# Patient Record
Sex: Female | Born: 1935 | Race: Black or African American | Hispanic: No | Smoking: Never smoker
Health system: Southern US, Community
[De-identification: ages and names within clinical notes are randomized; demographics above are authoritative.]

## PROBLEM LIST (undated history)

## (undated) DIAGNOSIS — M1990 Unspecified osteoarthritis, unspecified site: Secondary | ICD-10-CM

## (undated) DIAGNOSIS — I10 Essential (primary) hypertension: Secondary | ICD-10-CM

## (undated) DIAGNOSIS — F0390 Unspecified dementia without behavioral disturbance: Secondary | ICD-10-CM

---

## 1998-04-11 ENCOUNTER — Other Ambulatory Visit: Admission: RE | Admit: 1998-04-11 | Discharge: 1998-04-11 | Payer: Self-pay | Admitting: Family Medicine

## 1998-05-19 ENCOUNTER — Ambulatory Visit (HOSPITAL_COMMUNITY): Admission: RE | Admit: 1998-05-19 | Discharge: 1998-05-19 | Payer: Self-pay | Admitting: Cardiology

## 2000-02-06 ENCOUNTER — Encounter: Payer: Self-pay | Admitting: Cardiology

## 2000-02-06 ENCOUNTER — Ambulatory Visit (HOSPITAL_COMMUNITY): Admission: RE | Admit: 2000-02-06 | Discharge: 2000-02-06 | Payer: Self-pay | Admitting: Cardiology

## 2003-03-23 ENCOUNTER — Other Ambulatory Visit: Admission: RE | Admit: 2003-03-23 | Discharge: 2003-03-23 | Payer: Self-pay | Admitting: Family Medicine

## 2003-03-23 ENCOUNTER — Other Ambulatory Visit: Admission: RE | Admit: 2003-03-23 | Discharge: 2003-03-23 | Payer: Self-pay | Admitting: Anesthesiology

## 2004-12-01 ENCOUNTER — Emergency Department (HOSPITAL_COMMUNITY): Admission: EM | Admit: 2004-12-01 | Discharge: 2004-12-02 | Payer: Self-pay | Admitting: Emergency Medicine

## 2005-01-21 ENCOUNTER — Ambulatory Visit (HOSPITAL_COMMUNITY): Admission: RE | Admit: 2005-01-21 | Discharge: 2005-01-21 | Payer: Self-pay | Admitting: Cardiology

## 2005-01-29 ENCOUNTER — Inpatient Hospital Stay (HOSPITAL_BASED_OUTPATIENT_CLINIC_OR_DEPARTMENT_OTHER): Admission: RE | Admit: 2005-01-29 | Discharge: 2005-01-29 | Payer: Self-pay | Admitting: Cardiology

## 2008-10-03 ENCOUNTER — Emergency Department (HOSPITAL_COMMUNITY): Admission: EM | Admit: 2008-10-03 | Discharge: 2008-10-03 | Payer: Self-pay | Admitting: Family Medicine

## 2009-07-05 ENCOUNTER — Encounter: Admission: RE | Admit: 2009-07-05 | Discharge: 2009-07-05 | Payer: Self-pay | Admitting: Orthopedic Surgery

## 2009-09-05 ENCOUNTER — Emergency Department (HOSPITAL_COMMUNITY): Admission: EM | Admit: 2009-09-05 | Discharge: 2009-09-06 | Payer: Self-pay | Admitting: Emergency Medicine

## 2010-09-05 ENCOUNTER — Emergency Department (HOSPITAL_COMMUNITY): Admission: EM | Admit: 2010-09-05 | Discharge: 2010-09-05 | Payer: Self-pay | Admitting: Emergency Medicine

## 2010-11-19 ENCOUNTER — Encounter: Payer: Self-pay | Admitting: Cardiology

## 2011-01-08 LAB — CBC
HCT: 27.1 % — ABNORMAL LOW (ref 36.0–46.0)
Hemoglobin: 8.9 g/dL — ABNORMAL LOW (ref 12.0–15.0)
MCH: 28.9 pg (ref 26.0–34.0)
MCV: 88 fL (ref 78.0–100.0)
WBC: 5.7 10*3/uL (ref 4.0–10.5)

## 2011-01-08 LAB — DIFFERENTIAL
Basophils Absolute: 0 10*3/uL (ref 0.0–0.1)
Basophils Relative: 1 % (ref 0–1)
Eosinophils Absolute: 0.3 10*3/uL (ref 0.0–0.7)
Monocytes Absolute: 0.5 10*3/uL (ref 0.1–1.0)
Monocytes Relative: 10 % (ref 3–12)

## 2011-01-08 LAB — COMPREHENSIVE METABOLIC PANEL
AST: 26 U/L (ref 0–37)
BUN: 7 mg/dL (ref 6–23)
Creatinine, Ser: 0.62 mg/dL (ref 0.4–1.2)
Glucose, Bld: 92 mg/dL (ref 70–99)
Total Bilirubin: 0.9 mg/dL (ref 0.3–1.2)
Total Protein: 5.9 g/dL — ABNORMAL LOW (ref 6.0–8.3)

## 2011-01-08 LAB — URINALYSIS, ROUTINE W REFLEX MICROSCOPIC
Hgb urine dipstick: NEGATIVE
Ketones, ur: NEGATIVE mg/dL
Nitrite: NEGATIVE
Protein, ur: NEGATIVE mg/dL
Specific Gravity, Urine: 1.013 (ref 1.005–1.030)

## 2011-03-15 NOTE — Cardiovascular Report (Signed)
[name], [name]           ACCOUNT NO.:  [id number]   MEDICAL RECORD NO.:  [id number]          PATIENT TYPE:  OIB   LOCATION:  6501                         FACILITY:  [facility]   PHYSICIAN:  Mohan N. Sharyn Lull, M.D. DATE OF BIRTH:  Mar 10, 1927   DATE OF PROCEDURE:  01/29/2005  DATE OF DISCHARGE:                              CARDIAC CATHETERIZATION   PROCEDURE:  Left cardiac catheterization with selective left and right  coronary angiography, left ventriculography via right groin using Judkins  technique.   INDICATIONS FOR PROCEDURE:  Leah [name] is a 77 year old black female  with past medical history significant for hypertension, hypercholesteremia,  and degenerative joint disease who comes in with vague retrosternal chest  pain and left arm pain without associated symptoms of nausea, vomiting or  diaphoresis. Denies shortness of breath. Denies palpitation,  lightheadedness, or syncope. Denies PND, orthopnea, leg swelling. EKG done  in the office showed normal sinus rhythm with nonspecific ST-T wave changes.  The patient subsequently underwent Persantine Myoview on 01/21/2005 which  showed inducible ischemia in the inferior wall at the base with EF of 66%  and questionable mild scarring in the anteroseptal wall.   PAST MEDICAL HISTORY:  As above.   PAST SURGICAL HISTORY:  She had hysterectomy in 1989, C-section in 1969,  right foot surgery in 1994, and left foot and ankle fracture in 1995.   ALLERGIES:  PENICILLIN.   MEDICATIONS AT HOME:  1.  Lotrel 5/20, p.o. b.i.d. 2. Baby aspirin 81 milligrams p.o. q.d. 3.      Vicodin 5/500 p.r.n. for pain.   SOCIAL HISTORY:  She is married, has five children. Retired, worked as  Agricultural engineer. Born in [place], lives in [place]. No history  of smoking or alcohol abuse.   FAMILY HISTORY:  Positive for coronary artery disease and congestive heart  failure.   PHYSICAL EXAMINATION:  GENERAL: She is alert and oriented x3  in no acute  distress. VITAL SIGNS: Blood pressure was 160/84, pulse was 68 regular.  HEENT: Conjunctiva pink. NECK: Supple. No JVD, no bruit. LUNGS: Clear to  auscultation without rhonchi  or rubs. CARDIAC:  S1 and S2 was normal. There  was a soft S4 gallop. ABDOMEN: Soft, bowel sounds present, nontender.  EXTREMITIES: There is no clubbing, cyanosis, or edema.   IMPRESSION:  1.  Chest pain, mildly positive Persantine Myoview, rule out coronary      insufficiency.  2.  Uncontrolled hypertension.  3.  Hypercholesteremia.  4.  Elevated blood sugar, rule out diabetes mellitus.  5.  Degenerative joint disease.   PLAN:  Discussed with the patient regarding left cath, its risks, i.e.  death, MI, stroke, the need for emergency CABG, local vascular  complications, etc., and consented for the procedure.   PROCEDURE:  After obtaining informed consent, the patient was brought to the  cath lab and was placed on fluoroscopy table. Right groin was prepped and  draped in usual fashion. Xylocaine 2% was used for local anesthesia in the  right groin. With the help of a thin-wall needle, a 4-French arterial sheath  was placed. The sheath was aspirated and  flushed. Next, 4-French left  Judkins catheter was advanced over the wire under fluoroscopic guidance into  the ascending aorta. Wire was pulled out, the catheter was aspirated, and  connected to the manifold.  The catheter was further advanced and engaged  into left coronary ostium. Multiple views of the left system were taken.  Next, the catheter was disengaged and was pulled out over the wire and was  replaced with 4-French right Judkins catheter which was advanced over the  wire under fluoroscopic guidance up to the ascending aorta. Wire was pulled  out, the catheter was aspirated, and connected to the manifold. Catheter was  further advanced and engaged into right coronary ostium with multiple views  of the right system  taken. Next, the  catheter was disengaged and was pulled  out over the wire and was replaced with 4-French pigtail catheter which was  advanced over the wire under fluoroscopic guidance up to the ascending  aorta. Wire was pulled out, the catheter was aspirated, and connected to the  manifold. Catheter was further advanced across aortic valve into the LV. LV  pressures were recorded. Next, LV graft was done in 30 degrees RAO position.  Post angiographic pressures were recorded from LV and then pullback  pressures were recorded from the aorta. Next, the pigtail catheter was  pulled out over the wire. Sheaths aspirated and flushed.   FINDINGS:  LV showed good LV systolic function, EF of 60-65%. Left main was  patent. LAD has 15-20% distal stenosis. Diagonal one was very small which  has 40-50% mid stenosis. Diagonal two was small which was patent. Diagonal  three was small which was patent. Left circumflex was patent. OM1 was  patent. RCA has 40-50% ostial and proximal stenosis with haziness with TIMI  grade 3 distal flow. There was no dampening of pressure during the  injection. The patient tolerated procedure well. There are no complications.  The patient was transferred to recovery room in stable condition.      MNH/MEDQ  D:  01/29/2005  T:  01/29/2005  Job:  [id number]

## 2011-06-02 ENCOUNTER — Emergency Department (HOSPITAL_COMMUNITY)
Admission: EM | Admit: 2011-06-02 | Discharge: 2011-06-02 | Disposition: A | Discharge status: Home / Self Care | Payer: Medicare Other | Attending: Emergency Medicine | Admitting: Emergency Medicine

## 2011-06-02 ENCOUNTER — Emergency Department (HOSPITAL_COMMUNITY): Payer: Medicare Other

## 2011-06-02 DIAGNOSIS — I10 Essential (primary) hypertension: Secondary | ICD-10-CM | POA: Insufficient documentation

## 2011-06-02 DIAGNOSIS — S0180XA Unspecified open wound of other part of head, initial encounter: Secondary | ICD-10-CM | POA: Insufficient documentation

## 2011-06-02 DIAGNOSIS — M1990 Unspecified osteoarthritis, unspecified site: Secondary | ICD-10-CM | POA: Insufficient documentation

## 2011-06-02 DIAGNOSIS — R61 Generalized hyperhidrosis: Secondary | ICD-10-CM | POA: Insufficient documentation

## 2011-06-02 DIAGNOSIS — S0003XA Contusion of scalp, initial encounter: Secondary | ICD-10-CM | POA: Insufficient documentation

## 2011-06-02 DIAGNOSIS — R05 Cough: Secondary | ICD-10-CM | POA: Insufficient documentation

## 2011-06-02 DIAGNOSIS — E789 Disorder of lipoprotein metabolism, unspecified: Secondary | ICD-10-CM | POA: Insufficient documentation

## 2011-06-02 DIAGNOSIS — R059 Cough, unspecified: Secondary | ICD-10-CM | POA: Insufficient documentation

## 2011-06-02 DIAGNOSIS — Z79899 Other long term (current) drug therapy: Secondary | ICD-10-CM | POA: Insufficient documentation

## 2011-06-02 DIAGNOSIS — H18419 Arcus senilis, unspecified eye: Secondary | ICD-10-CM | POA: Insufficient documentation

## 2011-06-02 DIAGNOSIS — R110 Nausea: Secondary | ICD-10-CM | POA: Insufficient documentation

## 2011-06-02 DIAGNOSIS — S1093XA Contusion of unspecified part of neck, initial encounter: Secondary | ICD-10-CM | POA: Insufficient documentation

## 2011-06-02 DIAGNOSIS — W06XXXA Fall from bed, initial encounter: Secondary | ICD-10-CM | POA: Insufficient documentation

## 2011-06-02 LAB — URINALYSIS, ROUTINE W REFLEX MICROSCOPIC
Bilirubin Urine: NEGATIVE
Glucose, UA: NEGATIVE mg/dL
Ketones, ur: NEGATIVE mg/dL
Nitrite: NEGATIVE
Protein, ur: NEGATIVE mg/dL
Urobilinogen, UA: 0.2 mg/dL (ref 0.0–1.0)

## 2011-06-02 LAB — POCT I-STAT, CHEM 8
BUN: 46 mg/dL — ABNORMAL HIGH (ref 6–23)
Chloride: 108 mEq/L (ref 96–112)
Hemoglobin: 10.9 g/dL — ABNORMAL LOW (ref 12.0–15.0)
Sodium: 147 mEq/L — ABNORMAL HIGH (ref 135–145)

## 2011-06-02 LAB — DIFFERENTIAL
Basophils Relative: 0 % (ref 0–1)
Lymphocytes Relative: 12 % (ref 12–46)
Neutro Abs: 7.1 10*3/uL (ref 1.7–7.7)
Neutrophils Relative %: 81 % — ABNORMAL HIGH (ref 43–77)

## 2011-06-02 LAB — TROPONIN I: Troponin I: <0.3 ng/mL (ref <0.30)

## 2011-06-02 LAB — CBC
HCT: 32.4 % — ABNORMAL LOW (ref 36.0–46.0)
MCHC: 32.4 g/dL (ref 30.0–36.0)
MCV: 85.7 fL (ref 78.0–100.0)
Platelets: 285 10*3/uL (ref 150–400)
RDW: 14.1 % (ref 11.5–15.5)
WBC: 8.7 10*3/uL (ref 4.0–10.5)

## 2011-06-02 LAB — URINE MICROSCOPIC-ADD ON

## 2011-06-02 LAB — CK TOTAL AND CKMB (NOT AT ARMC): CK, MB: 2.7 ng/mL (ref 0.3–4.0)

## 2011-06-05 ENCOUNTER — Inpatient Hospital Stay (HOSPITAL_COMMUNITY)
Admission: EM | Admit: 2011-06-05 | Discharge: 2011-06-09 | DRG: 381 | Disposition: A | Discharge status: Home / Self Care | Payer: Medicare Other | Attending: Cardiology | Admitting: Cardiology

## 2011-06-05 ENCOUNTER — Emergency Department (HOSPITAL_COMMUNITY): Payer: Medicare Other

## 2011-06-05 DIAGNOSIS — I2510 Atherosclerotic heart disease of native coronary artery without angina pectoris: Secondary | ICD-10-CM | POA: Diagnosis present

## 2011-06-05 DIAGNOSIS — K2211 Ulcer of esophagus with bleeding: Principal | ICD-10-CM | POA: Diagnosis present

## 2011-06-05 DIAGNOSIS — N390 Urinary tract infection, site not specified: Secondary | ICD-10-CM | POA: Diagnosis present

## 2011-06-05 DIAGNOSIS — D500 Iron deficiency anemia secondary to blood loss (chronic): Secondary | ICD-10-CM | POA: Diagnosis present

## 2011-06-05 DIAGNOSIS — I10 Essential (primary) hypertension: Secondary | ICD-10-CM | POA: Diagnosis present

## 2011-06-05 DIAGNOSIS — Z79899 Other long term (current) drug therapy: Secondary | ICD-10-CM

## 2011-06-05 DIAGNOSIS — K254 Chronic or unspecified gastric ulcer with hemorrhage: Secondary | ICD-10-CM | POA: Diagnosis present

## 2011-06-05 DIAGNOSIS — K5909 Other constipation: Secondary | ICD-10-CM | POA: Diagnosis present

## 2011-06-05 DIAGNOSIS — E785 Hyperlipidemia, unspecified: Secondary | ICD-10-CM | POA: Diagnosis present

## 2011-06-05 DIAGNOSIS — M1990 Unspecified osteoarthritis, unspecified site: Secondary | ICD-10-CM | POA: Diagnosis present

## 2011-06-05 LAB — CBC
HCT: 27.1 % — ABNORMAL LOW (ref 36.0–46.0)
MCH: 28.5 pg (ref 26.0–34.0)
MCHC: 33.2 g/dL (ref 30.0–36.0)
Platelets: 332 10*3/uL (ref 150–400)
RDW: 14.1 % (ref 11.5–15.5)
WBC: 8.1 10*3/uL (ref 4.0–10.5)

## 2011-06-05 LAB — URINE MICROSCOPIC-ADD ON

## 2011-06-05 LAB — DIFFERENTIAL
Eosinophils Absolute: 0.2 10*3/uL (ref 0.0–0.7)
Eosinophils Relative: 3 % (ref 0–5)
Monocytes Absolute: 0.6 10*3/uL (ref 0.1–1.0)
Monocytes Relative: 8 % (ref 3–12)
Neutro Abs: 5.7 10*3/uL (ref 1.7–7.7)
Neutrophils Relative %: 71 % (ref 43–77)

## 2011-06-05 LAB — COMPREHENSIVE METABOLIC PANEL
BUN: 11 mg/dL (ref 6–23)
CO2: 27 mEq/L (ref 19–32)
Calcium: 8.9 mg/dL (ref 8.4–10.5)
Chloride: 102 mEq/L (ref 96–112)
Creatinine, Ser: 0.63 mg/dL (ref 0.50–1.10)
GFR calc Af Amer: >60 mL/min (ref >60)
GFR calc non Af Amer: >60 mL/min (ref >60)
Glucose, Bld: 90 mg/dL (ref 70–99)
Total Bilirubin: 0.4 mg/dL (ref 0.3–1.2)

## 2011-06-05 LAB — POCT I-STAT TROPONIN I

## 2011-06-05 LAB — GLUCOSE, CAPILLARY: Glucose-Capillary: 119 mg/dL — ABNORMAL HIGH (ref 70–99)

## 2011-06-05 LAB — URINALYSIS, ROUTINE W REFLEX MICROSCOPIC
Glucose, UA: NEGATIVE mg/dL
Urobilinogen, UA: 0.2 mg/dL (ref 0.0–1.0)

## 2011-06-05 LAB — BASIC METABOLIC PANEL
Calcium: 9.4 mg/dL (ref 8.4–10.5)
GFR calc Af Amer: >60 mL/min (ref >60)
GFR calc non Af Amer: >60 mL/min (ref >60)
Potassium: 4 mEq/L (ref 3.5–5.1)
Sodium: 141 mEq/L (ref 135–145)

## 2011-06-05 LAB — OCCULT BLOOD, POC DEVICE: Fecal Occult Bld: POSITIVE

## 2011-06-05 LAB — PROTIME-INR: Prothrombin Time: 13.7 seconds (ref 11.6–15.2)

## 2011-06-05 LAB — HEMOGLOBIN AND HEMATOCRIT, BLOOD
HCT: 26.2 % — ABNORMAL LOW (ref 36.0–46.0)
Hemoglobin: 8.8 g/dL — ABNORMAL LOW (ref 12.0–15.0)

## 2011-06-06 ENCOUNTER — Other Ambulatory Visit: Payer: Self-pay | Admitting: Gastroenterology

## 2011-06-06 LAB — BASIC METABOLIC PANEL
BUN: 6 mg/dL (ref 6–23)
GFR calc Af Amer: >60 mL/min (ref >60)
GFR calc non Af Amer: >60 mL/min (ref >60)
Potassium: 4.1 mEq/L (ref 3.5–5.1)
Sodium: 140 mEq/L (ref 135–145)

## 2011-06-06 LAB — LIPID PANEL
Cholesterol: 134 mg/dL (ref 0–200)
LDL Cholesterol: 76 mg/dL (ref 0–99)
Total CHOL/HDL Ratio: 2.7 RATIO
Triglycerides: 45 mg/dL (ref <150)
VLDL: 9 mg/dL (ref 0–40)

## 2011-06-06 LAB — URINE CULTURE: Culture  Setup Time: 201208081706

## 2011-06-06 LAB — CBC
HCT: 25.8 % — ABNORMAL LOW (ref 36.0–46.0)
MCHC: 32.9 g/dL (ref 30.0–36.0)
Platelets: 330 10*3/uL (ref 150–400)
RDW: 14.4 % (ref 11.5–15.5)

## 2011-06-06 LAB — HEMOGLOBIN AND HEMATOCRIT, BLOOD: Hemoglobin: 9.1 g/dL — ABNORMAL LOW (ref 12.0–15.0)

## 2011-06-07 LAB — URINALYSIS, MICROSCOPIC ONLY
Glucose, UA: NEGATIVE mg/dL
Ketones, ur: NEGATIVE mg/dL
Leukocytes, UA: NEGATIVE
Nitrite: NEGATIVE
Specific Gravity, Urine: 1.019 (ref 1.005–1.030)
pH: 5 (ref 5.0–8.0)

## 2011-06-07 LAB — CBC
Hemoglobin: 8.9 g/dL — ABNORMAL LOW (ref 12.0–15.0)
MCH: 27.8 pg (ref 26.0–34.0)
Platelets: 360 10*3/uL (ref 150–400)
RBC: 3.2 MIL/uL — ABNORMAL LOW (ref 3.87–5.11)
WBC: 6.4 10*3/uL (ref 4.0–10.5)

## 2011-06-08 LAB — URINE CULTURE: Culture: NO GROWTH

## 2011-06-08 LAB — CROSSMATCH
ABO/RH(D): O POS
Unit division: 0

## 2011-06-08 LAB — CBC
HCT: 26.7 % — ABNORMAL LOW (ref 36.0–46.0)
MCH: 28.2 pg (ref 26.0–34.0)
MCHC: 33 g/dL (ref 30.0–36.0)
MCV: 85.6 fL (ref 78.0–100.0)
Platelets: 349 10*3/uL (ref 150–400)
RDW: 14.5 % (ref 11.5–15.5)
WBC: 7.4 10*3/uL (ref 4.0–10.5)

## 2011-06-08 LAB — HEMOGLOBIN AND HEMATOCRIT, BLOOD: Hemoglobin: 8.9 g/dL — ABNORMAL LOW (ref 12.0–15.0)

## 2011-06-08 LAB — BASIC METABOLIC PANEL
BUN: 14 mg/dL (ref 6–23)
Calcium: 9.4 mg/dL (ref 8.4–10.5)
Chloride: 108 mEq/L (ref 96–112)
Creatinine, Ser: 0.75 mg/dL (ref 0.50–1.10)
GFR calc Af Amer: >60 mL/min (ref >60)

## 2011-06-09 LAB — CBC
HCT: 27.3 % — ABNORMAL LOW (ref 36.0–46.0)
Hemoglobin: 9 g/dL — ABNORMAL LOW (ref 12.0–15.0)
MCHC: 33 g/dL (ref 30.0–36.0)
RDW: 14.5 % (ref 11.5–15.5)
WBC: 7.4 10*3/uL (ref 4.0–10.5)

## 2011-07-10 NOTE — Consult Note (Signed)
[name], [name]           ACCOUNT NO.:  [id number]  MEDICAL RECORD NO.:  [id number]  LOCATION:  3742                         FACILITY:  [facility]  PHYSICIAN:  Anselmo Rod, MD, FACGDATE OF BIRTH:  10-16-1927  DATE OF CONSULTATION:  06/05/2011 DATE OF DISCHARGE:                                CONSULTATION   REASON FOR CONSULTATION: 1. Melena for the last 5 months. 2. Anemia with hemoglobin of 9 g/dl on admission.  ASSESSMENT: 1. Melena since March of this year with recent syncope and anemia,     rule out peptic ulcer disease, arteriovenous malformations versus     colonic masses, polyps, etc.  Patient has an increased BUN of 46,     question possible upper gastrointestinal bleed. 2. Chronic constipation on Correctol and Milk of Magnesia at bedtime. 3. Hypertension on Norvasc and Lotensin at home. 4. Coronary artery disease. 5. Hyperlipidemia. 6. Degenerative joint disease on Advil and Aleve recently. 7. Status post partial hysterectomy in the remote past. 8. History of 7 pregnancies and 2 stillbirths with 5 livebirths. 9. History of cesarean section in remote past. 10.History of right foot surgery in the past. 11.History of left ankle fracture.  RECOMMENDATIONS: 1. Esophagogastroduodenoscopy tomorrow morning.  Patient should be     kept NPO after midnight tonight. 2. Colonoscopy as soon as possible.  Patient claims she has never had     a baseline screening colonoscopy in the past. 3. Serial CBCs to be continued. 4. Type and cross, transfuse as needed to keep the hemoglobin above 8     g/dl. 5. Avoid all nonsteroidals for now.  DISCUSSION:  [name] is a pleasant 77 year old black female with above-mentioned medical problems, who had a question of syncopal versus near-syncopal episode 3 days ago while trying to get out of bed and hit her forehead on the left side against the playpen laying close to her bed.  She said she was cold and clammy and had  black stools.  Since March of this year, she claims, stool samples have been done in the past, but is not aware of the results.  She denies having a screening colonoscopy in the past.  She has never been to see a gastroenterologist as best she can recollect.  She has 1-2 BMs per day after taking Correctol and Milk of Magnesia on a daily basis, which she usually does at night.  Appetite is fairly good.  Her weight has been stable.  She has been taking Aleve and Advil lately to help with her arthritis.  There is no history of bright red bleeding per rectum.  She denies history of ulcers, jaundice, or colitis.  She has had some abdominal queasiness when she takes aspirin products and therefore she stopped her regular aspirin, decreased the aspirin to 81 mg per day. She denies any shortness of breath, palpitation, or lightheadedness at this time.  There is no history of fevers, chills, or rigors.  PAST MEDICAL HISTORY:  See list above.  ALLERGIES:  PENICILLIN.  MEDICATIONS AT HOME:  Lotensin, Norvasc.  SOCIAL HISTORY:  She is a widow.  She is a retired Agricultural engineer. She was born in [place] and lives in  [place].  She has 5 children.  She denies use of alcohol, tobacco, or drugs.  FAMILY HISTORY:  Positive for coronary artery disease and congestive heart failure.  There is no family history of colon cancer or gastric cancer to the best of her knowledge.  REVIEW OF SYSTEMS: 1. Black stools. 2. One episode of coffee-ground emesis on the day of admission. 3. Chronic constipation. 4. Joint pain.  GENERAL PHYSICAL EXAMINATION:  GENERAL:  Reveals a pleasant, cooperative, elderly, black female laying comfortably in bed in no acute distress.  She is alert and oriented x3. VITAL SIGNS:  Temperature 98.2, blood pressure 136/69, pulse 70 per minute and regular, respiratory rate 18. NECK:  Supple. CHEST:  Clear to auscultation. HEART:  S1, S2 regular. ABDOMEN:  Soft with a  central scar in the midline below the umbilicus. Abdomen nontender with normal bowel sounds.  No hepatosplenomegaly appreciated. RECTAL:  Examination was not done today.  LABORATORY EVALUATION:  White count 8.12 with a hemoglobin of 9 and hematocrit of 47.1, MCV 85.8.  PT 13.7 with INR of 1.03 and a PTT of 32. Her BUN was 46 on August 5th, but has normalized since admission and is 11 mg/dl today.  CMP otherwise reveals an albumin of 3.1, total protein 5.7, calcium 8.9.  An H and H done earlier today reveals a hemoglobin of 8.8 with a hematocrit of 26.2.  Chest x-ray done today revealed prominent thoracic aorta, but no other cardiopulmonary disease.  CT of the head without contrast done on admission revealed no acute abnormalities.  PLAN:  As above.  Further recommendations will be made in follow up.     Anselmo Rod, MD, [facility]     JNM/MEDQ  D:  06/05/2011  T:  06/06/2011  Job:  [id number]  cc:   Eduardo Osier. Sharyn Lull, M.D.  Electronically Signed by Charna Elizabeth M.D. on 07/10/2011 06:40:42 PM

## 2011-08-07 NOTE — Discharge Summary (Signed)
NAMEALDEEN, RIGA           ACCOUNT NO.:  [id number]  MEDICAL RECORD NO.:  [id number]  LOCATION:  3742                         FACILITY:  [facility]  PHYSICIAN:  Shakera Ebrahimi N. Sharyn Lull, M.D. DATE OF BIRTH:  [birth year]  DATE OF ADMISSION:  06/05/2011 DATE OF DISCHARGE:  06/09/2011                              DISCHARGE SUMMARY   ADMITTING DIAGNOSES: 1. Acute upper gastrointestinal bleeding. 2. Coronary artery disease. 3. Hypertension. 4. Hypercholesteremia. 5. Anemia secondary to #1. 6. Degenerative joint disease.  DISCHARGE DIAGNOSES: 1. Status post upper gastrointestinal bleeding secondary to esophageal     and antral ulcers. 2. Anemia secondary to upper gastrointestinal bleeding as above,     stable. 3. Coronary artery disease. 4. Hypertension. 5. Hypercholesteremia. 6. History of syncope in the past. 7. Constipation. 8. Probable urinary tract infection.  DISCHARGE HOME MEDICATIONS: 1. Amlodipine 5 mg 1 tablet daily. 2. Ciprofloxacin 500 mg 1 tablet twice daily for 5 more days. 3. Protonix 40 mg 1 tablet twice daily with meals. 4. Carafate 1 g 3 times daily between meals. 5. Benazepril 20 mg 1 tablet twice daily.  DIET:  Low salt, low cholesterol.  ACTIVITY:  Increase activity slowly as tolerated.  Follow up with me in 1 week.  Follow up with GI, Dr. Loreta Ave, in 2 weeks.  CONDITION AT DISCHARGE:  Stable.  BRIEF HISTORY AND HOSPITAL COURSE:  [name] is an 77 year old black female with past medical history significant for moderate coronary artery disease, hypertension, hypercholesteremia, degenerative joint disease, who came to the ER, feeling cold, shaky, associated with black tarry stool for the last few weeks.  States she had fall a few days ago and was noted to have hemoglobin of 11 a few days ago and today with significant drop of hemoglobin from 11 to 9.  States has been taking 3 Aleve daily because of joint pain.  Denies any chest pain, nausea,  vomiting, diaphoresis.  Denies PND, orthopnea, and leg swelling. Denies palpitation, lightheadedness, or syncope.  Denies any abdominal pain.  No fever, no chills, or urinary complaints.  PAST MEDICAL HISTORY:  As above.  PAST SURGICAL HISTORY:  She had hysterectomy in the past, had C-section in the past, had right foot surgery in the past, had left ankle and foot fracture in the past.  ALLERGIES:  She is allergic to PENICILLIN.  MEDICATION AT HOME:  She was on Norvasc and Lotensin.  SOCIAL HISTORY:  She is widowed, retired, worked as Agricultural engineer in the past.  No history of smoking or alcohol abuse.  FAMILY HISTORY:  Positive for coronary artery disease and congestive heart failure.  PHYSICAL EXAMINATION:  GENERAL:  She was alert, awake, and oriented x3, in no acute distress. VITAL SIGNS:  Blood pressure was 152/66, pulse was 80 and regular. HEENT:  Conjunctivae were pink. NECK:  Supple.  No JVD, no bruit. LUNGS:  Clear to auscultation without rhonchi or rales. CARDIOVASCULAR:  S1 and S2 was normal.  There was soft systolic murmur. There was no S3 gallop. ABDOMEN:  Soft.  Bowel sounds were present.  There was mild epigastric tenderness.  There was no guarding. EXTREMITIES:  There was no clubbing, cyanosis, or edema.  LABS:  Her admission hemoglobin was 9, hematocrit 27.1, white count of 8.1.  Her repeat hemoglobin on June 06, 2011, was 8.5, hematocrit 25.8. Repeat hemoglobin on June 08, 2011, was 8.8, hematocrit 26.7.  On June 09, 2011, hemoglobin was 9, hematocrit 27.3 which has been stable.  Her sodium was 141, potassium 4.0, BUN was 12, creatinine 0.73, glucose was 123.  Repeat electrolytes on June 08, 2011; sodium 143, potassium 3.9, chloride 108, bicarb 29, glucose 82, BUN 14, creatinine 0.75.  Her total protein was low at 5.7, albumin 3.1.  Liver enzymes were all normal.  Cholesterol was 134, triglycerides 45, HDL 49, LDL 76. Urinalysis showed urine  appearance was hazy and there were many epithelial cells, small leukocyte esterase, few bacteria.  Urine culture grew more than 100 colonies/mL, multiple bacterial morphotypes present. Repeat culture on antibiotics, there was no growth.  Occult stool blood was positive.  Her chest x-ray showed no acute cardiopulmonary disease or significant interval change.  BRIEF HOSPITAL COURSE:  The patient was admitted to telemetry unit.  GI consultation was obtained with Dr. Loreta Ave.  The patient subsequently underwent upper endoscopy which showed distal esophageal ulcer and multiple antral ulcers were noted which were biopsied.  The patient's H and H were monitored closely and remained stable during the hospital stay.  The patient did not have any further episodes of GI bleeding during the hospital stay.  The patient was started on Carafate and PPI with resolution of abdominal tenderness.  The patient's hemoglobin remained stable and was discharged home on June 09, 2011, in stable condition.  The patient will be followed up in my office in 1 week and GI in 2 weeks.  The patient was advised to stay off nonsteroidal antiinflammatory medications including also aspirin and was discharged in stable condition on June 09, 2011.     Eduardo Osier. Sharyn Lull, M.D.     MNH/MEDQ  D:  07/10/2011  T:  07/11/2011  Job:  [id number]  Electronically Signed by Rinaldo Cloud M.D. on 08/07/2011 07:31:20 PM

## 2012-06-05 ENCOUNTER — Other Ambulatory Visit: Payer: Self-pay

## 2012-06-05 ENCOUNTER — Emergency Department (HOSPITAL_COMMUNITY)
Admission: EM | Admit: 2012-06-05 | Discharge: 2012-06-05 | Disposition: A | Discharge status: Home / Self Care | Payer: Medicare Other | Attending: Emergency Medicine | Admitting: Emergency Medicine

## 2012-06-05 ENCOUNTER — Emergency Department (HOSPITAL_COMMUNITY): Payer: Medicare Other

## 2012-06-05 ENCOUNTER — Encounter (HOSPITAL_COMMUNITY): Payer: Self-pay | Admitting: Emergency Medicine

## 2012-06-05 DIAGNOSIS — J069 Acute upper respiratory infection, unspecified: Secondary | ICD-10-CM

## 2012-06-05 DIAGNOSIS — R42 Dizziness and giddiness: Secondary | ICD-10-CM | POA: Insufficient documentation

## 2012-06-05 DIAGNOSIS — I10 Essential (primary) hypertension: Secondary | ICD-10-CM | POA: Insufficient documentation

## 2012-06-05 DIAGNOSIS — F17200 Nicotine dependence, unspecified, uncomplicated: Secondary | ICD-10-CM | POA: Insufficient documentation

## 2012-06-05 HISTORY — DX: Essential (primary) hypertension: I10

## 2012-06-05 LAB — BASIC METABOLIC PANEL
Calcium: 10.1 mg/dL (ref 8.4–10.5)
GFR calc Af Amer: 87 mL/min — ABNORMAL LOW (ref >90)
GFR calc non Af Amer: 75 mL/min — ABNORMAL LOW (ref >90)
Glucose, Bld: 82 mg/dL (ref 70–99)
Sodium: 140 mEq/L (ref 135–145)

## 2012-06-05 LAB — URINALYSIS, ROUTINE W REFLEX MICROSCOPIC
Hgb urine dipstick: NEGATIVE
Protein, ur: NEGATIVE mg/dL
Urobilinogen, UA: 0.2 mg/dL (ref 0.0–1.0)

## 2012-06-05 LAB — CBC WITH DIFFERENTIAL/PLATELET
Basophils Absolute: 0 10*3/uL (ref 0.0–0.1)
Basophils Relative: 1 % (ref 0–1)
Eosinophils Absolute: 0.1 10*3/uL (ref 0.0–0.7)
Eosinophils Relative: 2 % (ref 0–5)
Lymphs Abs: 2 10*3/uL (ref 0.7–4.0)
MCH: 28.2 pg (ref 26.0–34.0)
MCHC: 33.3 g/dL (ref 30.0–36.0)
MCV: 84.7 fL (ref 78.0–100.0)
Platelets: 324 10*3/uL (ref 150–400)
RDW: 12.7 % (ref 11.5–15.5)

## 2012-06-05 MED ORDER — TRAMADOL HCL 50 MG PO TABS: 50.0000 mg | ORAL_TABLET | Freq: Four times a day (QID) | ORAL | Status: AC | PRN

## 2012-06-05 NOTE — ED Notes (Signed)
Pt updated on wait times. NAD noted. No complaints at this time.

## 2012-06-05 NOTE — ED Notes (Signed)
Pt c/o dizziness intermittently x 4 days; pt sts feels like room is spinning

## 2012-06-05 NOTE — ED Provider Notes (Addendum)
History   This chart was scribed for Celene Kras, MD by Toya Smothers. The patient was seen in room TR10C/TR10C. Patient's care was started at 1154.  CSN: [id number] Arrival date & time 06/05/12  1154 First MD Initiated Contact with Patient 06/05/12 1707      Chief Complaint  Patient presents with  . Dizziness   The history is provided by the patient. No language interpreter was used.    Leah Glass is a 78 y.o. female with a h/o hypertension presents to the ED complaining of chills and involuntary occular movements. Pt reports that 4 days ago she began feeling sudden intermittent chills with associate HA, that deviate from asymptomatic baseline behavior. Pt spoke with PCP who advised Bayer ASA with no relief. 3 days ago Pt also began to have involuntary occular movements of her left eye. Pt denies speech change, coordination change, dizziness, fever, and emesis. She denies headache at this time.  No chest pain.  Not feeling dizzy or lightheaded.  She feels she can walk without difficulty.  Past Medical History  Diagnosis Date  . Hypertension     History reviewed. No pertinent past surgical history.  History reviewed. No pertinent family history.  History  Substance Use Topics  . Smoking status: Current Everyday Smoker  . Smokeless tobacco: Not on file  . Alcohol Use: No   Review of Systems  Constitutional: Positive for chills. Negative for fever.  HENT: Negative for hearing loss, ear pain and rhinorrhea.   Eyes: Negative for pain.  Respiratory: Negative for cough and shortness of breath.   Cardiovascular: Negative for chest pain.  Gastrointestinal: Negative for nausea, vomiting, abdominal pain and diarrhea.  Genitourinary: Negative for dysuria, hematuria and flank pain.  Musculoskeletal: Negative for back pain and gait problem.  Skin: Negative for pallor, rash and wound.  Neurological: Positive for light-headedness and headaches. Negative for dizziness, syncope, speech  difficulty, weakness and numbness.  All other systems reviewed and are negative.    Allergies  Penicillins  Home Medications   Current Outpatient Rx  Name Route Sig Dispense Refill  . AMLODIPINE BESYLATE 5 MG PO TABS Oral Take 5 mg by mouth 2 (two) times daily.    . ASPIRIN EC 81 MG PO TBEC Oral Take 81 mg by mouth daily.    Marland Kitchen BENAZEPRIL HCL 20 MG PO TABS Oral Take 20 mg by mouth 2 (two) times daily.    Marland Kitchen FERROUS SULFATE 325 (65 FE) MG PO TABS Oral Take 325 mg by mouth daily with breakfast.    . PANTOPRAZOLE SODIUM 40 MG PO TBEC Oral Take 40 mg by mouth 2 (two) times daily.      BP 161/80  Pulse 64  Temp 98.2 F (36.8 C) (Oral)  Resp 16  SpO2 100%  Physical Exam  Nursing note and vitals reviewed. Constitutional: No distress.       Elderly.  HENT:  Head: Normocephalic and atraumatic.  Right Ear: External ear normal.  Left Ear: External ear normal.  Mouth/Throat: No oropharyngeal exudate.  Eyes: Conjunctivae and EOM are normal. Pupils are equal, round, and reactive to light. Right eye exhibits no discharge. Left eye exhibits no discharge. No scleral icterus.  Neck: Neck supple. No tracheal deviation present.  Cardiovascular: Normal rate, regular rhythm and intact distal pulses.   No murmur heard. Pulmonary/Chest: Effort normal and breath sounds normal. No stridor. No respiratory distress. She has no wheezes.  Abdominal: Soft. Bowel sounds are normal. She exhibits no distension.  There is no tenderness. There is no rebound.  Musculoskeletal: She exhibits no edema and no tenderness.       Baseline ROM, moves extremities with no obvious new focal weakness. Normal gait.  Lymphadenopathy:    She has cervical adenopathy (mild bilaterally).  Neurological: She is alert. She has normal strength. No sensory deficit. Cranial nerve deficit:  no gross defecits noted. She exhibits normal muscle tone. She displays no seizure activity. Coordination normal.       Awake, alert, cooperative  and aware of situation; motor strength bilaterally; sensation normal to light touch bilaterally; peripheral visual fields full to confrontation; no facial asymmetry; tongue midline; major cranial nerves appear intact; no pronator drift, normal finger to nose bilaterally, baseline gait without new ataxia.  Skin: Skin is warm and dry. No rash noted.  Psychiatric: She has a normal mood and affect.    ED Course  Procedures (including critical care time) DIAGNOSTIC STUDIES: Oxygen Saturation is 100% on room air, normal by my interpretation.    COORDINATION OF CARE: 1209- Ordered CBC w/ DifferentialSTAT 1700- Ordered EKG Once. 1716- Evaluated Pt. Pt is without distress, awake, and oriented.   Labs Reviewed  BASIC METABOLIC PANEL - Abnormal; Notable for the following:    GFR calc non Af Amer 75 (*)     GFR calc Af Amer 87 (*)     All other components within normal limits  URINALYSIS, ROUTINE W REFLEX MICROSCOPIC - Abnormal; Notable for the following:    APPearance CLOUDY (*)     All other components within normal limits  CBC WITH DIFFERENTIAL   Dg Chest 2 View  06/05/2012  *RADIOLOGY REPORT*  Clinical Data: Dizziness.  CHEST - 2 VIEW  Comparison: Portable chest x-ray 06/05/2011.  Two-view chest x-ray 10/03/2008.  Findings: Cardiac silhouette normal in size, unchanged.  Thoracic aorta tortuous and mildly atherosclerotic, unchanged.  Hilar and mediastinal contours otherwise unremarkable.  Lungs clear. Bronchovascular markings normal.  Pulmonary vascularity normal.  No pneumothorax.  No pleural effusions.  Degenerative changes throughout the thoracic spine.  No significant interval change.  IMPRESSION: No acute cardiopulmonary disease.  Stable examination.  Original Report Authenticated By: Arnell Sieving, M.D.     MDM  Pt without signs of acute infection.  No focal neurologic complaints.  No chest pain or shortness of breath.  She does mention some recent uri symptoms and she does have some  mild cervical chain lymphadenopathy.  May be related to URI.  At this time there does not appear to be any evidence of an acute emergency medical condition and the patient appears stable for discharge with appropriate outpatient follow up.  At this time there does not appear to be any evidence of an acute emergency medical condition and the patient appears stable for discharge with appropriate outpatient follow up.   Celene Kras, MD 06/05/12 [phone]

## 2012-07-06 ENCOUNTER — Other Ambulatory Visit (HOSPITAL_COMMUNITY): Payer: Self-pay | Admitting: Cardiology

## 2012-07-06 DIAGNOSIS — R079 Chest pain, unspecified: Secondary | ICD-10-CM

## 2012-07-09 ENCOUNTER — Emergency Department (HOSPITAL_COMMUNITY)
Admission: EM | Admit: 2012-07-09 | Discharge: 2012-07-09 | Disposition: A | Discharge status: Home / Self Care | Payer: Medicare Other | Attending: Emergency Medicine | Admitting: Emergency Medicine

## 2012-07-09 ENCOUNTER — Encounter (HOSPITAL_COMMUNITY): Payer: Self-pay | Admitting: *Deleted

## 2012-07-09 DIAGNOSIS — R5381 Other malaise: Secondary | ICD-10-CM | POA: Insufficient documentation

## 2012-07-09 DIAGNOSIS — R42 Dizziness and giddiness: Secondary | ICD-10-CM | POA: Insufficient documentation

## 2012-07-09 DIAGNOSIS — R5383 Other fatigue: Secondary | ICD-10-CM | POA: Insufficient documentation

## 2012-07-09 DIAGNOSIS — N390 Urinary tract infection, site not specified: Secondary | ICD-10-CM

## 2012-07-09 LAB — BASIC METABOLIC PANEL
Calcium: 9.5 mg/dL (ref 8.4–10.5)
Chloride: 99 mEq/L (ref 96–112)
Creatinine, Ser: 0.85 mg/dL (ref 0.50–1.10)
GFR calc Af Amer: 71 mL/min — ABNORMAL LOW (ref >90)

## 2012-07-09 LAB — URINALYSIS, ROUTINE W REFLEX MICROSCOPIC
Ketones, ur: NEGATIVE mg/dL
Nitrite: NEGATIVE
Protein, ur: NEGATIVE mg/dL
Urobilinogen, UA: 0.2 mg/dL (ref 0.0–1.0)

## 2012-07-09 LAB — CBC
MCH: 28.6 pg (ref 26.0–34.0)
MCV: 85.9 fL (ref 78.0–100.0)
Platelets: 364 10*3/uL (ref 150–400)
RDW: 12.6 % (ref 11.5–15.5)
WBC: 5.3 10*3/uL (ref 4.0–10.5)

## 2012-07-09 MED ORDER — CIPROFLOXACIN HCL 500 MG PO TABS: 500.0000 mg | ORAL_TABLET | Freq: Two times a day (BID) | ORAL | Status: AC

## 2012-07-09 MED ORDER — SODIUM CHLORIDE 0.9 % IV SOLN
INTRAVENOUS | Status: DC
  Administered 2012-07-09: 1000 mL via INTRAVENOUS

## 2012-07-09 MED ORDER — CIPROFLOXACIN HCL 500 MG PO TABS
500.0000 mg | ORAL_TABLET | Freq: Once | ORAL | Status: AC
  Administered 2012-07-09: 500 mg via ORAL
  Filled 2012-07-09: qty 1

## 2012-07-09 NOTE — ED Notes (Signed)
Pt also states she's been having night sweats where she has to change her night gown in the middle of the night d/t being wet. Pt states has lost weight over the past month too.

## 2012-07-09 NOTE — ED Notes (Signed)
Pt with increased dizziness and weakness today

## 2012-07-09 NOTE — ED Provider Notes (Addendum)
History     CSN: [id number]  Arrival date & time 07/09/12  1731   First MD Initiated Contact with Patient 07/09/12 1928      Chief Complaint  Patient presents with  . Dizziness  . Weakness    (Consider location/radiation/quality/duration/timing/severity/associated sxs/prior treatment) The history is provided by the patient and a relative.   she complains of lightheadedness, and weakness for approximately one week.  She says her symptoms are worse in the morning.  She says that sometimes she wakes up and she is soaking, with sweat.  She denies pain anywhere.  She has not had a fever.  She denies cough, or shortness of breath.  She denies nausea, vomiting, diarrhea.  She denies dysuria, or hematuria.  Past Medical History  Diagnosis Date  . Hypertension     History reviewed. No pertinent past surgical history.  No family history on file.  History  Substance Use Topics  . Smoking status: Never Smoker   . Smokeless tobacco: Not on file  . Alcohol Use: No    OB History    Grav Para Term Preterm Abortions TAB SAB Ect Mult Living                  Review of Systems  Constitutional: Positive for diaphoresis. Negative for fever and chills.  HENT: Negative for congestion.   Respiratory: Negative for cough and shortness of breath.   Cardiovascular: Negative for chest pain.  Gastrointestinal: Negative for abdominal pain.  Genitourinary: Negative for dysuria and hematuria.  Musculoskeletal: Negative for back pain.  Skin: Negative for rash.  Neurological: Positive for light-headedness.  Psychiatric/Behavioral: Negative for confusion.  All other systems reviewed and are negative.    Allergies  Anesthetics, amide and Penicillins  Home Medications   Current Outpatient Rx  Name Route Sig Dispense Refill  . AMLODIPINE BESYLATE 5 MG PO TABS Oral Take 5 mg by mouth 2 (two) times daily.    . ASPIRIN EC 81 MG PO TBEC Oral Take 81 mg by mouth daily.    Marland Kitchen BENAZEPRIL HCL 20 MG  PO TABS Oral Take 20 mg by mouth 2 (two) times daily.    Marland Kitchen PANTOPRAZOLE SODIUM 40 MG PO TBEC Oral Take 40 mg by mouth every morning.     . SUCRALFATE 1 G PO TABS Oral Take 1 g by mouth 3 (three) times daily as needed. ulcers    . TRAMADOL HCL 50 MG PO TABS Oral Take 50 mg by mouth every 6 (six) hours as needed. pain      BP 130/61  Pulse 63  Temp 99.4 F (37.4 C) (Oral)  Resp 15  SpO2 96%  Physical Exam  Nursing note and vitals reviewed. Constitutional: She is oriented to person, place, and time. She appears well-developed and well-nourished. No distress.  HENT:  Head: Normocephalic and atraumatic.  Eyes: Conjunctivae normal and EOM are normal.  Neck: Normal range of motion. Neck supple. No JVD present.       No carotid bruits  Cardiovascular: Normal rate, regular rhythm and intact distal pulses.   No murmur heard. Pulmonary/Chest: Effort normal and breath sounds normal.  Abdominal: Soft. She exhibits no distension. There is no tenderness.  Musculoskeletal: Normal range of motion.  Neurological: She is alert and oriented to person, place, and time.  Skin: Skin is warm and dry.  Psychiatric: She has a normal mood and affect. Thought content normal.    ED Course  Procedures (including critical care time) 78 year old, female,  with no significant past medical history presents emergency department complaining of lightheadedness, and weakness for approximately a week.  Symptoms worsen.  The morning.  She has had soaking, sweating, at night time, but denies any other symptoms.  Her physical examination is unremarkable.  We will do screening laboratory testing, to try to determine if there is an etiology for her symptoms.  Labs Reviewed  BASIC METABOLIC PANEL - Abnormal; Notable for the following:    Potassium 3.1 (*)     GFR calc non Af Amer 62 (*)     GFR calc Af Amer 71 (*)     All other components within normal limits  URINALYSIS, ROUTINE W REFLEX MICROSCOPIC - Abnormal; Notable  for the following:    APPearance CLOUDY (*)     Leukocytes, UA TRACE (*)     All other components within normal limits  URINE MICROSCOPIC-ADD ON - Abnormal; Notable for the following:    Bacteria, UA MANY (*)     All other components within normal limits  CBC   No results found.   No diagnosis found.    MDM  uti        Cheri Guppy, MD 07/09/12 2204  Cheri Guppy, MD 07/09/12 2211

## 2012-07-09 NOTE — ED Notes (Addendum)
Rx given x1 Pt ambulating independently w/ steady gait on d/c in no acute distress, A&Ox4. D/c instructions reviewed w/ pt and family - pt and family deny any further questions or concerns at present. Pt also educated on foods high in potassium to help w/ increasing potassium level.

## 2012-07-09 NOTE — ED Notes (Signed)
Pt states for the past week she's been having dizzy spells every other day, went to PCP on Monday for the dizziness, pt states when dizziness comes she also gets blurred vision and she sits down and it goes away, comes first thing in the morning then goes away, now comes and goes throughout the day. Denies pain, shortness of breath. Pt states "last week it felt like someone was hitting me in my chest but that's gone".

## 2012-07-15 ENCOUNTER — Encounter (HOSPITAL_COMMUNITY)
Admission: RE | Admit: 2012-07-15 | Discharge: 2012-07-15 | Disposition: A | Discharge status: Home / Self Care | Payer: Medicare Other | Source: Ambulatory Visit | Attending: Cardiology | Admitting: Cardiology

## 2012-07-15 ENCOUNTER — Other Ambulatory Visit: Payer: Self-pay

## 2012-07-15 DIAGNOSIS — R079 Chest pain, unspecified: Secondary | ICD-10-CM | POA: Insufficient documentation

## 2012-07-15 MED ORDER — REGADENOSON 0.4 MG/5ML IV SOLN
0.4000 mg | Freq: Once | INTRAVENOUS | Status: AC
  Administered 2012-07-15: 0.4 mg via INTRAVENOUS

## 2012-07-15 MED ORDER — REGADENOSON 0.4 MG/5ML IV SOLN
INTRAVENOUS | Status: AC
  Filled 2012-07-15: qty 5

## 2012-07-15 MED ORDER — TECHNETIUM TC 99M SESTAMIBI GENERIC - CARDIOLITE: 10.0000 | Freq: Once | INTRAVENOUS | Status: DC | PRN

## 2012-07-15 MED ORDER — TECHNETIUM TC 99M SESTAMIBI - CARDIOLITE: 30.0000 | Freq: Once | INTRAVENOUS | Status: DC | PRN

## 2012-07-15 MED ORDER — ACETAMINOPHEN 325 MG PO TABS
ORAL_TABLET | ORAL | Status: AC
  Filled 2012-07-15: qty 2

## 2012-07-15 MED ORDER — ACETAMINOPHEN 325 MG PO TABS
650.0000 mg | ORAL_TABLET | Freq: Once | ORAL | Status: AC
  Administered 2012-07-15: 650 mg via ORAL

## 2012-08-07 ENCOUNTER — Emergency Department (HOSPITAL_COMMUNITY)
Admission: EM | Admit: 2012-08-07 | Discharge: 2012-08-07 | Disposition: A | Discharge status: Home / Self Care | Payer: Medicare Other | Attending: Emergency Medicine | Admitting: Emergency Medicine

## 2012-08-07 ENCOUNTER — Emergency Department (HOSPITAL_COMMUNITY): Payer: Medicare Other

## 2012-08-07 ENCOUNTER — Encounter (HOSPITAL_COMMUNITY): Payer: Self-pay | Admitting: *Deleted

## 2012-08-07 DIAGNOSIS — M25519 Pain in unspecified shoulder: Secondary | ICD-10-CM | POA: Insufficient documentation

## 2012-08-07 DIAGNOSIS — M1990 Unspecified osteoarthritis, unspecified site: Secondary | ICD-10-CM

## 2012-08-07 DIAGNOSIS — I10 Essential (primary) hypertension: Secondary | ICD-10-CM | POA: Insufficient documentation

## 2012-08-07 MED ORDER — PREDNISONE 50 MG PO TABS: 50.0000 mg | ORAL_TABLET | Freq: Every day | ORAL | Status: DC

## 2012-08-07 MED ORDER — HYDROCODONE-ACETAMINOPHEN 5-325 MG PO TABS: 1.0000 | ORAL_TABLET | Freq: Three times a day (TID) | ORAL | Status: DC | PRN

## 2012-08-07 NOTE — ED Notes (Signed)
Patient transported to X-ray 

## 2012-08-07 NOTE — ED Notes (Signed)
Pt states that she woke up with right shoulder pain. Pt states that the pain continued all day and pt states she was able to sleep, but when she woke up throughout the night she noticed the pain. Pt denies injury, fall or known cause.

## 2012-08-07 NOTE — ED Provider Notes (Signed)
Pt with no fever, atraumatic right shoulder pain, swollen, tender to palpate diffusely and with ROM, limited due to pain.  Plain films shows sig arthritis.  RICE at home, pain control.  Follow up with PCP.  Medical screening examination/treatment/procedure(s) were conducted as a shared visit with non-physician practitioner(s) and myself.  I personally evaluated the patient during the encounter   Leah Glass. [name], MD 08/09/12 1659

## 2012-08-07 NOTE — ED Provider Notes (Signed)
History     CSN: [id number]  Arrival date & time 08/07/12  4098   First MD Initiated Contact with Patient 08/07/12 [phone]      Chief Complaint  Patient presents with  . Shoulder Pain    (Consider location/radiation/quality/duration/timing/severity/associated sxs/prior treatment) HPI The patient presents to the emergency department with a 1 day history of right shoulder pain.  The pain began yesterday morning when the patient was getting dressed in the morning.  She denies injury, fall, or a previous history of injury.  The pain is aching and prevented the patient from sleeping last night.  She states she is comfortable sitting in the bed right now.  The pain is worse when she is getting dressed.  She reports right hand discomfort that began the same time yesterday.  She denies neck pain, extremity weakness, numbness, swelling, erythema, discoloration, and deformity.    Past Medical History  Diagnosis Date  . Hypertension     History reviewed. No pertinent past surgical history.  History reviewed. No pertinent family history.  History  Substance Use Topics  . Smoking status: Never Smoker   . Smokeless tobacco: Not on file  . Alcohol Use: No    OB History    Grav Para Term Preterm Abortions TAB SAB Ect Mult Living                  Review of Systems All pertinent positives and negatives in the history of present illness   Allergies  Anesthetics, amide and Penicillins  Home Medications   Current Outpatient Rx  Name Route Sig Dispense Refill  . AMLODIPINE BESYLATE 5 MG PO TABS Oral Take 5 mg by mouth daily.     . ASPIRIN EC 81 MG PO TBEC Oral Take 81 mg by mouth 2 (two) times daily.     Marland Kitchen BENAZEPRIL HCL 20 MG PO TABS Oral Take 20 mg by mouth daily.     Marland Kitchen CIPROFLOXACIN HCL 500 MG PO TABS Oral Take 500 mg by mouth daily.    . IRON PO Oral Take 1 tablet by mouth 2 (two) times daily.    Marland Kitchen PANTOPRAZOLE SODIUM 40 MG PO TBEC Oral Take 40 mg by mouth every morning.     .  SUCRALFATE 1 G PO TABS Oral Take 1 g by mouth 3 (three) times daily as needed. ulcers    . TRAMADOL HCL 50 MG PO TABS Oral Take 50 mg by mouth every 6 (six) hours as needed. pain      BP 173/69  Pulse 79  Temp 98.9 F (37.2 C) (Oral)  Resp 16  SpO2 98%  Physical Exam  Constitutional: She appears well-developed and well-nourished.  HENT:  Head: Normocephalic and atraumatic.  Neck: Normal range of motion. Neck supple.  Cardiovascular: Intact distal pulses.   Musculoskeletal:       Right shoulder: She exhibits decreased range of motion, tenderness and bony tenderness. She exhibits no swelling, no laceration and normal pulse.       Right elbow: Normal.      Right wrist: Normal.       Cervical back: Normal.       Arms:      Decreased ROM secondary to pain   Neurological: She is alert. No sensory deficit.  Skin: Skin is warm and dry. No rash noted. No erythema. No pallor.    ED Course  Procedures (including critical care time)  Labs Reviewed - No data to display Dg Shoulder Right  08/07/2012  *RADIOLOGY REPORT*  Clinical Data: Shoulder pain, no injury  RIGHT SHOULDER - 2+ VIEW  Comparison: Chest x-ray with partial imaging of the right shoulder 06/05/2012  Findings: No acute fracture, or malalignment.  The humeral head is located with respect to the glenoid.  There is degenerative osteoarthritis of both the glenohumeral and acromioclavicular joints.  The humeral head is slightly high-riding and there is narrowing of the subacromial space as can be seen in shoulder impingement.  Additionally, downward projecting osteophytes noted at the acromioclavicular joint.  Visualized thorax is unremarkable.  IMPRESSION:  1.  No acute fracture, or malalignment. 2.  Moderately advanced degenerative osteoarthritis of the acromioclavicular and glenohumeral joints.  Downward projecting acromioclavicular osteophytes, and narrowing of the subacromial space can be seen in patients with shoulder impingement.    Original Report Authenticated By: Sterling Big, M.D.      Patient has degenerative changes noted on her x-rays. The patient will be referred to ortho for further care and assessment. The patient is told to return here as needed. Ice and heat on her shoulder.   MDM  MDM Reviewed: vitals and nursing note Interpretation: x-ray            Carlyle Dolly, PA-C 08/10/12 0630

## 2012-12-07 ENCOUNTER — Emergency Department (HOSPITAL_COMMUNITY): Payer: Medicare Other

## 2012-12-07 ENCOUNTER — Encounter (HOSPITAL_COMMUNITY): Payer: Self-pay | Admitting: Nurse Practitioner

## 2012-12-07 ENCOUNTER — Emergency Department (HOSPITAL_COMMUNITY)
Admission: EM | Admit: 2012-12-07 | Discharge: 2012-12-07 | Disposition: A | Discharge status: Home / Self Care | Payer: Medicare Other | Attending: Emergency Medicine | Admitting: Emergency Medicine

## 2012-12-07 DIAGNOSIS — R3915 Urgency of urination: Secondary | ICD-10-CM | POA: Insufficient documentation

## 2012-12-07 DIAGNOSIS — Z7982 Long term (current) use of aspirin: Secondary | ICD-10-CM | POA: Insufficient documentation

## 2012-12-07 DIAGNOSIS — Z8739 Personal history of other diseases of the musculoskeletal system and connective tissue: Secondary | ICD-10-CM | POA: Insufficient documentation

## 2012-12-07 DIAGNOSIS — R51 Headache: Secondary | ICD-10-CM | POA: Insufficient documentation

## 2012-12-07 DIAGNOSIS — Z79899 Other long term (current) drug therapy: Secondary | ICD-10-CM | POA: Insufficient documentation

## 2012-12-07 DIAGNOSIS — R42 Dizziness and giddiness: Secondary | ICD-10-CM | POA: Insufficient documentation

## 2012-12-07 DIAGNOSIS — I10 Essential (primary) hypertension: Secondary | ICD-10-CM | POA: Insufficient documentation

## 2012-12-07 DIAGNOSIS — R109 Unspecified abdominal pain: Secondary | ICD-10-CM | POA: Insufficient documentation

## 2012-12-07 HISTORY — DX: Unspecified osteoarthritis, unspecified site: M19.90

## 2012-12-07 LAB — URINALYSIS, ROUTINE W REFLEX MICROSCOPIC
Bilirubin Urine: NEGATIVE
Ketones, ur: NEGATIVE mg/dL
Leukocytes, UA: NEGATIVE
Nitrite: NEGATIVE
Protein, ur: NEGATIVE mg/dL

## 2012-12-07 LAB — COMPREHENSIVE METABOLIC PANEL
Alkaline Phosphatase: 80 U/L (ref 39–117)
BUN: 8 mg/dL (ref 6–23)
CO2: 28 mEq/L (ref 19–32)
Chloride: 102 mEq/L (ref 96–112)
GFR calc Af Amer: >90 mL/min (ref >90)
Glucose, Bld: 91 mg/dL (ref 70–99)
Potassium: 3.5 mEq/L (ref 3.5–5.1)
Total Bilirubin: 0.4 mg/dL (ref 0.3–1.2)

## 2012-12-07 LAB — CBC WITH DIFFERENTIAL/PLATELET
HCT: 41.9 % (ref 36.0–46.0)
Lymphocytes Relative: 40 % (ref 12–46)
Lymphs Abs: 2.3 10*3/uL (ref 0.7–4.0)
MCHC: 32.2 g/dL (ref 30.0–36.0)
MCV: 86.6 fL (ref 78.0–100.0)
Monocytes Relative: 8 % (ref 3–12)
Neutro Abs: 2.6 10*3/uL (ref 1.7–7.7)
Neutrophils Relative %: 45 % (ref 43–77)
WBC: 5.8 10*3/uL (ref 4.0–10.5)

## 2012-12-07 NOTE — ED Notes (Signed)
MD at bedside. 

## 2012-12-07 NOTE — ED Provider Notes (Signed)
History  This chart was scribed for Loren Racer, MD by Bennett Scrape, ED Scribe. This patient was seen in room A09C/A09C and the patient's care was started at 7:18 PM.  CSN: [id number]  Arrival date & time 12/07/12  1642   First MD Initiated Contact with Patient 12/07/12 1918      Chief Complaint  Patient presents with  . Dizziness     The history is provided by the patient. No language interpreter was used.   Leah Glass is a 79 y.o. female who presents to the Emergency Department complaining of 3 days of intermittent lightheadedness episodes described as feeling faint that last around one hour with associated HA upon waking. She states that this morning was the worse episode but she is unsure of the duration. She c/o HA currently but denies feeling lightheaded currently. She denies that she is changing positions or moving suddenly when the lightheadedness starts and denies prior episodes. She also c/o abdominal pain and urgency but denies cough, CP, fevers, nausea, emesis, SOB and LOC as associated symptoms. She has a h/o HTN and denies smoking and alcohol use.   Past Medical History  Diagnosis Date  . Hypertension   . Arthritis     History reviewed. No pertinent past surgical history.  History reviewed. No pertinent family history.  History  Substance Use Topics  . Smoking status: Never Smoker   . Smokeless tobacco: Not on file  . Alcohol Use: No    No OB history provided.  Review of Systems  Eyes: Negative for visual disturbance.  Respiratory: Negative for cough and shortness of breath.   Cardiovascular: Negative for chest pain.  Gastrointestinal: Positive for abdominal pain. Negative for nausea, vomiting and diarrhea.  Genitourinary: Positive for urgency. Negative for frequency.  Neurological: Positive for light-headedness and headaches. Negative for syncope.  All other systems reviewed and are negative.    Allergies  Anesthetics, amide and  Penicillins  Home Medications   Current Outpatient Rx  Name  Route  Sig  Dispense  Refill  . amLODipine (NORVASC) 5 MG tablet   Oral   Take 5 mg by mouth daily.          Marland Kitchen aspirin EC 81 MG tablet   Oral   Take 81 mg by mouth daily.          . benazepril (LOTENSIN) 20 MG tablet   Oral   Take 20 mg by mouth daily.          . IRON PO   Oral   Take 1 tablet by mouth 2 (two) times daily.         . pantoprazole (PROTONIX) 40 MG tablet   Oral   Take 40 mg by mouth every morning.          . sucralfate (CARAFATE) 1 G tablet   Oral   Take 1 g by mouth 3 (three) times daily as needed. ulcers         . traMADol (ULTRAM) 50 MG tablet   Oral   Take 50 mg by mouth every 6 (six) hours as needed. pain           Triage Vitals: BP 155/73  Pulse 56  Temp(Src) 98.3 F (36.8 C) (Oral)  Resp 18  SpO2 100%  Physical Exam  Nursing note and vitals reviewed. Constitutional: She is oriented to person, place, and time. She appears well-developed and well-nourished. No distress.  HENT:  Head: Normocephalic and atraumatic.  Adentiton  Eyes: Conjunctivae and EOM are normal.  Neck: Neck supple. No tracheal deviation present.  Cardiovascular: Normal rate and regular rhythm.   Pulmonary/Chest: Effort normal and breath sounds normal. No respiratory distress. She exhibits no tenderness.  Abdominal: Soft. There is tenderness (mild suprapubic tenderness to palpation).  Musculoskeletal: Normal range of motion. She exhibits no edema.  Neurological: She is alert and oriented to person, place, and time. No cranial nerve deficit.  No ataxia on finger to nose, negative pronator's drift, sensation is intact, 5/5 strength throughout, CN 2 through 12 are intact  Skin: Skin is warm and dry.  Psychiatric: She has a normal mood and affect. Her behavior is normal.    ED Course  Procedures (including critical care time)  DIAGNOSTIC STUDIES: Oxygen Saturation is 100% on room air, normal by  my interpretation.    COORDINATION OF CARE: 7:32 PM-Discussed treatment plan which includes CT of head, CXR, CBC panel, and UA with pt at bedside and pt agreed to plan.   10:13 PM-Pt rechecked and feels improved. Informed pt and daughter of lab and radiology results. Recommend PO hydration and following up with PCP. Pt is comfortable being discharged home and agrees to plan.  Labs Reviewed  COMPREHENSIVE METABOLIC PANEL - Abnormal; Notable for the following:    GFR calc non Af Amer 79 (*)    All other components within normal limits  URINALYSIS, ROUTINE W REFLEX MICROSCOPIC - Abnormal; Notable for the following:    APPearance CLOUDY (*)    All other components within normal limits  CBC WITH DIFFERENTIAL   Dg Chest 2 View  12/07/2012  *RADIOLOGY REPORT*  Clinical Data: Headache.  Chronic cough.  Hypertension.  CHEST - 2 VIEW  Comparison: 06/05/2012  Findings: Heart size is normal.  There is calcification and unfolding of the thoracic aorta.  The lungs are hyperinflated but clear.  The vascularity is normal.  No effusions.  No significant bony finding.  IMPRESSION: Unfolded aorta consistent with hypertension.  Pulmonary hyperinflation suggesting emphysema.  No acute finding.   Original Report Authenticated By: Paulina Fusi, M.D.    Ct Head Wo Contrast  12/07/2012  *RADIOLOGY REPORT*  Clinical Data: Dizziness  CT HEAD WITHOUT CONTRAST  Technique:  Contiguous axial images were obtained from the base of the skull through the vertex without contrast.  Comparison: 06/02/2011  Findings: The brain stem, cerebellum, cerebral peduncles, thalami, basal ganglia, basilar cisterns, and ventricular system appear unremarkable.  No intracranial hemorrhage, mass lesion, or acute infarction is identified.  The visualized paranasal sinuses appear clear.  Middle ears unremarkable.  IMPRESSION:  1.  No significant intracranial abnormality observed.   Original Report Authenticated By: Gaylyn Rong, M.D.      1.  Positional lightheadedness       MDM  I personally performed the services described in this documentation, which was scribed in my presence. The recorded information has been reviewed and is accurate.    Loren Racer, MD 12/07/12 2322

## 2012-12-07 NOTE — ED Notes (Signed)
States for past 3 mornings when she wakes up she feels "Swimmy headed like I'm going to pass out." states this morning symptoms were worse and she felt she might vomit. States symptoms resolve after a few hours but return every morning. A&Ox4, resp e/u, c/o mild abd pain.

## 2012-12-07 NOTE — ED Notes (Signed)
Pt transported to radiology.

## 2012-12-09 ENCOUNTER — Encounter (HOSPITAL_COMMUNITY): Payer: Self-pay | Admitting: *Deleted

## 2012-12-09 ENCOUNTER — Emergency Department (HOSPITAL_COMMUNITY)
Admission: EM | Admit: 2012-12-09 | Discharge: 2012-12-09 | Disposition: A | Discharge status: Home / Self Care | Payer: Medicare Other | Attending: Emergency Medicine | Admitting: Emergency Medicine

## 2012-12-09 DIAGNOSIS — I10 Essential (primary) hypertension: Secondary | ICD-10-CM | POA: Insufficient documentation

## 2012-12-09 DIAGNOSIS — R6883 Chills (without fever): Secondary | ICD-10-CM | POA: Insufficient documentation

## 2012-12-09 DIAGNOSIS — Z79899 Other long term (current) drug therapy: Secondary | ICD-10-CM | POA: Insufficient documentation

## 2012-12-09 DIAGNOSIS — M129 Arthropathy, unspecified: Secondary | ICD-10-CM | POA: Insufficient documentation

## 2012-12-09 LAB — CBC WITH DIFFERENTIAL/PLATELET
Basophils Absolute: 0 10*3/uL (ref 0.0–0.1)
Basophils Relative: 1 % (ref 0–1)
Eosinophils Relative: 4 % (ref 0–5)
Lymphocytes Relative: 30 % (ref 12–46)
MCHC: 32.9 g/dL (ref 30.0–36.0)
MCV: 86 fL (ref 78.0–100.0)
Monocytes Absolute: 0.4 10*3/uL (ref 0.1–1.0)
Platelets: 294 10*3/uL (ref 150–400)
RDW: 12.6 % (ref 11.5–15.5)
WBC: 5 10*3/uL (ref 4.0–10.5)

## 2012-12-09 LAB — URINALYSIS, ROUTINE W REFLEX MICROSCOPIC
Bilirubin Urine: NEGATIVE
Ketones, ur: NEGATIVE mg/dL
Leukocytes, UA: NEGATIVE
Nitrite: NEGATIVE
Protein, ur: NEGATIVE mg/dL
Urobilinogen, UA: 0.2 mg/dL (ref 0.0–1.0)
pH: 8 (ref 5.0–8.0)

## 2012-12-09 LAB — BASIC METABOLIC PANEL
BUN: 8 mg/dL (ref 6–23)
CO2: 31 mEq/L (ref 19–32)
Calcium: 9.8 mg/dL (ref 8.4–10.5)
Creatinine, Ser: 0.68 mg/dL (ref 0.50–1.10)
GFR calc Af Amer: >90 mL/min (ref >90)

## 2012-12-09 NOTE — ED Provider Notes (Signed)
History     CSN: [id number]  Arrival date & time 12/09/12  1308   First MD Initiated Contact with Patient 12/09/12 1106      Chief Complaint  Patient presents with  . Chills     The history is provided by the patient and a relative.  chills Onset -this morning Course - resolved Worsened by - nothing Improved by - nothing  Pt presents for chills and feeling cold.  No fever reported.  No cp/sob. She reports mild nausea that resolved.  No abd pain.  No vomiting.  No syncope.  No focal weakness.  She is now improved.  No new meds but she admits that she has cut back on her BP meds.  She reports recent eval for dizziness has improved.    Past Medical History  Diagnosis Date  . Hypertension   . Arthritis     History reviewed. No pertinent past surgical history.  History reviewed. No pertinent family history.  History  Substance Use Topics  . Smoking status: Never Smoker   . Smokeless tobacco: Not on file  . Alcohol Use: No    OB History   Grav Para Term Preterm Abortions TAB SAB Ect Mult Living                  Review of Systems  Constitutional: Negative for fatigue.  Respiratory: Negative for shortness of breath.   Cardiovascular: Negative for chest pain.  Neurological: Negative for weakness.  All other systems reviewed and are negative.    Allergies  Anesthetics, amide and Penicillins  Home Medications   Current Outpatient Rx  Name  Route  Sig  Dispense  Refill  . amLODipine (NORVASC) 5 MG tablet   Oral   Take 5 mg by mouth daily.          Marland Kitchen aspirin EC 81 MG tablet   Oral   Take 81 mg by mouth daily.          . benazepril (LOTENSIN) 20 MG tablet   Oral   Take 20 mg by mouth daily.          . IRON PO   Oral   Take 1 tablet by mouth 2 (two) times daily.         . pantoprazole (PROTONIX) 40 MG tablet   Oral   Take 40 mg by mouth every morning.          Marland Kitchen Phenylephrine-DM-GG (TUSSIN CF PO)   Oral   Take 10 mLs by mouth daily as  needed. For cold and congestion         . Potassium (POTASSIMIN PO)   Oral   Take 1 tablet by mouth daily.         . sucralfate (CARAFATE) 1 G tablet   Oral   Take 1 g by mouth 3 (three) times daily as needed. ulcers         . traMADol (ULTRAM) 50 MG tablet   Oral   Take 50 mg by mouth every 6 (six) hours as needed. pain           BP 154/60  Pulse 66  Temp(Src) 97.9 F (36.6 C) (Oral)  Resp 16  SpO2 100%  Physical Exam CONSTITUTIONAL: Well developed/well nourished HEAD AND FACE: Normocephalic/atraumatic EYES: EOMI/PERRL, no nystagmus ENMT: Mucous membranes moist NECK: supple no meningeal signs, no bruits SPINE:entire spine nontender CV: S1/S2 noted, no murmurs/rubs/gallops noted LUNGS: Lungs are clear to auscultation bilaterally, no apparent  distress ABDOMEN: soft, nontender, no rebound or guarding GU:no cva tenderness NEURO:Awake/alert, facies symmetric, no arm or leg drift is noted Cranial nerves 3/4/5/6/05/05/09/11/12 tested and intact Gait normal No past pointing EXTREMITIES: pulses normal, full ROM SKIN: warm, color normal PSYCH: no abnormalities of mood noted     ED Course  Procedures   Labs Reviewed  BASIC METABOLIC PANEL - Abnormal; Notable for the following:    GFR calc non Af Amer 79 (*)    All other components within normal limits  CBC WITH DIFFERENTIAL  URINALYSIS, ROUTINE W REFLEX MICROSCOPIC   Dg Chest 2 View  12/07/2012  *RADIOLOGY REPORT*  Clinical Data: Headache.  Chronic cough.  Hypertension.  CHEST - 2 VIEW  Comparison: 06/05/2012  Findings: Heart size is normal.  There is calcification and unfolding of the thoracic aorta.  The lungs are hyperinflated but clear.  The vascularity is normal.  No effusions.  No significant bony finding.  IMPRESSION: Unfolded aorta consistent with hypertension.  Pulmonary hyperinflation suggesting emphysema.  No acute finding.   Original Report Authenticated By: Paulina Fusi, M.D.    Ct Head Wo  Contrast  12/07/2012  *RADIOLOGY REPORT*  Clinical Data: Dizziness  CT HEAD WITHOUT CONTRAST  Technique:  Contiguous axial images were obtained from the base of the skull through the vertex without contrast.  Comparison: 06/02/2011  Findings: The brain stem, cerebellum, cerebral peduncles, thalami, basal ganglia, basilar cisterns, and ventricular system appear unremarkable.  No intracranial hemorrhage, mass lesion, or acute infarction is identified.  The visualized paranasal sinuses appear clear.  Middle ears unremarkable.  IMPRESSION:  1.  No significant intracranial abnormality observed.   Original Report Authenticated By: Gaylyn Rong, M.D.      1. Chills      Pt feels improved.  She is in no distress, resting comfortably and talkative.  She is ambulatory at her baseline.  She tells me she only really had chills and mild nausea all of which had improved by my eval.  Her labs are unremarkable.  I performed EKG which showed some inverted t waves and nonspecific ST changes but no STEMI.  I doubt this is ACS.  I advised close f/u with her cardiologist for further monitoring of her BP.  Patient and family are agreeable  MDM  Nursing notes including past medical history and social history reviewed and considered in documentation Previous records reviewed and considered - recent ED visit reviewed        Date: 12/09/2012  Rate: 67  Rhythm: normal sinus rhythm  QRS Axis: normal  Intervals: normal  ST/T Wave abnormalities: nonspecific ST changes  Conduction Disutrbances:none  Narrative Interpretation:   Old EKG Reviewed: no significant change    Joya Gaskins, MD 12/09/12 1404

## 2012-12-09 NOTE — ED Notes (Signed)
Pt reports feeling cold and like she is freezing since waking up this am. Having leg pain. Pt afebrile at triage, given warm blanket. Was here on Monday for near syncope. No acute distress noted at triage.

## 2012-12-16 ENCOUNTER — Other Ambulatory Visit: Payer: Self-pay | Admitting: Family Medicine

## 2012-12-16 ENCOUNTER — Ambulatory Visit (INDEPENDENT_AMBULATORY_CARE_PROVIDER_SITE_OTHER): Payer: Medicare Other | Admitting: Family Medicine

## 2012-12-16 VITALS — BP 164/79 | HR 60 | Temp 98.2°F | Resp 16 | Ht 62.25 in | Wt 124.8 lb

## 2012-12-16 DIAGNOSIS — E049 Nontoxic goiter, unspecified: Secondary | ICD-10-CM

## 2012-12-16 DIAGNOSIS — J3489 Other specified disorders of nose and nasal sinuses: Secondary | ICD-10-CM

## 2012-12-16 DIAGNOSIS — R634 Abnormal weight loss: Secondary | ICD-10-CM

## 2012-12-16 DIAGNOSIS — I10 Essential (primary) hypertension: Secondary | ICD-10-CM | POA: Insufficient documentation

## 2012-12-16 MED ORDER — FLUTICASONE PROPIONATE 50 MCG/ACT NA SUSP: 2.0000 | Freq: Every day | NASAL | Status: DC

## 2012-12-16 NOTE — Patient Instructions (Addendum)
We will be in touch regarding your thyroid test and your neck ultrasound.  If you get worse or if your symptoms change in the meantime please let me know!  If these tests are normal we will need to continue to look into your weight loss.    I am going to refer you to [facility] to establish with a primary care provider

## 2012-12-16 NOTE — Progress Notes (Signed)
[facility] [address], [place] [address] [phone]  Date:  12/16/2012   Name:  MELLISA ARSHAD   DOB:  1927/10/23   MRN:  [id number]  PCP:  Robynn Pane, MD    Chief Complaint: Chills and Adenopathy   History of Present Illness:  CHARMIKA MACDONNELL is a 79 y.o. very pleasant female patient who presents with the following:  Here is a new patient today- She was in the ED on 2/10 with dizziness, and on 2/12 with chills/ feeling cold.  She had a CMP, CBC, CXR and urine   She is here today because "for the last month or so when I get up in the morning I feel like I just got out of the freezer."  She also notes "2 bumps under her chin," and a "cold since 2012 with cough and runny nose." They have tried singulair and sudafed but she continues to have "the sniffles"   She does not have a PCP- Dr. Sharyn Lull treats her for HTN.  She just saw him on Monday They also noted she has lost 40 lbs over the last 3 months- she has not kept up with her screening tests such as mammograms or colonoscopy. She is here with her daughter today There is no problem list on file for this patient.   Past Medical History  Diagnosis Date  . Hypertension   . Arthritis     History reviewed. No pertinent past surgical history.  History  Substance Use Topics  . Smoking status: Never Smoker   . Smokeless tobacco: Not on file  . Alcohol Use: No    No family history on file.  Allergies  Allergen Reactions  . Anesthetics, Amide Other (See Comments)    Was put in intensive care  . Penicillins Other (See Comments)    Put pt in intensive care    Medication list has been reviewed and updated.  Current Outpatient Prescriptions on File Prior to Visit  Medication Sig Dispense Refill  . amLODipine (NORVASC) 5 MG tablet Take 5 mg by mouth daily.       . benazepril (LOTENSIN) 20 MG tablet Take 20 mg by mouth daily.       . pantoprazole (PROTONIX) 40 MG tablet Take 40 mg by  mouth every morning.       . traMADol (ULTRAM) 50 MG tablet Take 50 mg by mouth every 6 (six) hours as needed. pain      . aspirin EC 81 MG tablet Take 81 mg by mouth daily.       . IRON PO Take 1 tablet by mouth 2 (two) times daily.      Marland Kitchen Phenylephrine-DM-GG (TUSSIN CF PO) Take 10 mLs by mouth daily as needed. For cold and congestion      . Potassium (POTASSIMIN PO) Take 1 tablet by mouth daily.      . sucralfate (CARAFATE) 1 G tablet Take 1 g by mouth 3 (three) times daily as needed. ulcers       No current facility-administered medications on file prior to visit.    Review of Systems:  As per HPI- otherwise negative.   Physical Examination: Filed Vitals:   12/16/12 1137  BP: 164/79  Pulse: 60  Temp: 98.2 F (36.8 C)  Resp: 16   Filed Vitals:   12/16/12 1137  Height: 5' 2.25" (1.581 m)  Weight: 124 lb 12.8 oz (56.609 kg)   Body mass index is 22.65 kg/(m^2).  Ideal Body Weight: Weight in (lb) to have BMI = 25: 137.5  GEN: WDWN, NAD, Non-toxic, A & O x 3 HEENT: Atraumatic, Normocephalic. Neck supple. No LAD.  Bilateral TM wnl, oropharynx normal.  PEERL,EOMI.   She does have symetric enlargement of the submandibular area bilaterally- unsure if thyroid enlargement or LAD.   Ears and Nose: No external deformity. CV: RRR, No M/G/R. No JVD. No thrill. No extra heart sounds. PULM: CTA B, no wheezes, crackles, rhonchi. No retractions. No resp. distress. No accessory muscle use. ABD: S, NT, ND, +BS. No rebound. No HSM. EXTR: No c/c/e NEURO Normal gait.  PSYCH: Normally interactive. Conversant. Not depressed or anxious appearing.  Calm demeanor.    Assessment and Plan Loss of weight - Plan: TSH, Ambulatory referral to Geriatrics  Thyroid enlargement - Plan: US Soft Tissue Head/Neck  Rhinorrhea - Plan: fluticasone (FLONASE) 50 MCG/ACT nasal spray  HTN (hypertension)  See patient instructions for more details.      Abbe Amsterdam, MD

## 2012-12-17 ENCOUNTER — Telehealth: Payer: Self-pay | Admitting: Family Medicine

## 2012-12-17 DIAGNOSIS — E0590 Thyrotoxicosis, unspecified without thyrotoxic crisis or storm: Secondary | ICD-10-CM

## 2012-12-17 MED ORDER — METHIMAZOLE 10 MG PO TABS: 10.0000 mg | ORAL_TABLET | Freq: Every day | ORAL | Status: DC

## 2012-12-17 NOTE — Telephone Encounter (Signed)
Called and spoke with pt and also with her niece alexis- Khloee's thyroid gland is overactive, and I am going to start her on tapazole and refer her to endocrinology. She has her ultrasound on Monday Her symptoms are mild and her pulse was 60 at our last visit so will not start a beta blocker at this time She will let me know if any problems in the meantime

## 2012-12-18 ENCOUNTER — Telehealth: Payer: Self-pay | Admitting: Family Medicine

## 2012-12-18 LAB — T4, FREE: Free T4: 1.23 ng/dL (ref 0.80–1.80)

## 2012-12-18 NOTE — Telephone Encounter (Signed)
Received her T3/T4 results.  They are in normal range, so called Dr. Talmage Nap for advice.  She recommended that we have Leah Glass take just 5mg  of methimazole instead of 10 mg.  Called [place], [name]'s aunt who will give her this information.

## 2012-12-21 ENCOUNTER — Ambulatory Visit
Admission: RE | Admit: 2012-12-21 | Discharge: 2012-12-21 | Disposition: A | Discharge status: Home / Self Care | Payer: Medicare Other | Source: Ambulatory Visit | Attending: Family Medicine | Admitting: Family Medicine

## 2012-12-21 DIAGNOSIS — E049 Nontoxic goiter, unspecified: Secondary | ICD-10-CM

## 2012-12-22 ENCOUNTER — Telehealth: Payer: Self-pay | Admitting: Family Medicine

## 2012-12-22 NOTE — Telephone Encounter (Signed)
Called and discussed her ultrasound results with Dr. Talmage Nap- we will do a radioactive iodine uptake scan.  Discussed with Bob's niece Jon Gills and let her know the plan.  She will need to stop her methimazole a week in advance once we have the exact date of her scan.      Placed order for test- will be done at [facility].    Also called Jaymi to keep her updated but she was not at home

## 2013-01-04 ENCOUNTER — Telehealth: Payer: Self-pay | Admitting: Radiology

## 2013-01-04 DIAGNOSIS — E041 Nontoxic single thyroid nodule: Secondary | ICD-10-CM

## 2013-01-04 NOTE — Telephone Encounter (Signed)
We have gotten notice from [facility] care medicare patients thyroid nuclear med scan needs to be pre certed. This has gone for peer to peer. We will need to call [phone] the case # is [id number] .Ins company has DOB [birth year]. I have called with Dr Patsy Lager and she has spoken to [employer].

## 2013-01-04 NOTE — Telephone Encounter (Signed)
Incorrect study ordered have ordered correct study. Leah Glass advised. [name]

## 2013-01-05 ENCOUNTER — Encounter (HOSPITAL_COMMUNITY): Payer: Medicare Other

## 2013-01-06 ENCOUNTER — Encounter (HOSPITAL_COMMUNITY): Payer: Medicare Other

## 2013-01-25 ENCOUNTER — Encounter (HOSPITAL_COMMUNITY): Admission: RE | Admit: 2013-01-25 | Payer: Medicare Other | Source: Ambulatory Visit

## 2013-01-26 ENCOUNTER — Encounter (HOSPITAL_COMMUNITY): Payer: Medicare Other

## 2013-02-08 ENCOUNTER — Encounter (HOSPITAL_COMMUNITY)
Admission: RE | Admit: 2013-02-08 | Discharge: 2013-02-08 | Disposition: A | Discharge status: Home / Self Care | Payer: Medicare Other | Source: Ambulatory Visit | Attending: Family Medicine | Admitting: Family Medicine

## 2013-02-08 DIAGNOSIS — E041 Nontoxic single thyroid nodule: Secondary | ICD-10-CM

## 2013-02-08 DIAGNOSIS — E042 Nontoxic multinodular goiter: Secondary | ICD-10-CM | POA: Insufficient documentation

## 2013-02-09 ENCOUNTER — Encounter (HOSPITAL_COMMUNITY)
Admission: RE | Admit: 2013-02-09 | Discharge: 2013-02-09 | Disposition: A | Discharge status: Home / Self Care | Payer: Medicare Other | Source: Ambulatory Visit | Attending: Family Medicine | Admitting: Family Medicine

## 2013-02-09 MED ORDER — SODIUM IODIDE I 131 CAPSULE
10.4000 | Freq: Once | INTRAVENOUS | Status: AC | PRN
  Administered 2013-02-08: 10.4 via ORAL

## 2013-02-09 MED ORDER — SODIUM PERTECHNETATE TC 99M INJECTION
10.6000 | Freq: Once | INTRAVENOUS | Status: AC | PRN
  Administered 2013-02-09: 11 via INTRAVENOUS

## 2013-02-11 ENCOUNTER — Telehealth: Payer: Self-pay | Admitting: Family Medicine

## 2013-02-11 NOTE — Telephone Encounter (Signed)
Received the results of her thyroid scan.  Called Dr Willeen Cass office- Chaka was scheduled for an appt in March but did not appear.  Called her niece Jon Gills who stated that Maythe refused to go to endocrinology and also refused to attend her appt at [facility].  She plans to try and reschedule her aunt with senior care, and I gave her the number there.   Called Ms. Dawood at home but she was not available to talk

## 2013-02-12 NOTE — Telephone Encounter (Signed)
Called her back and was able to speak with her.  Said she would be willing to see an endocrinologist if we reschedule her appt

## 2013-02-15 ENCOUNTER — Telehealth: Payer: Self-pay | Admitting: Family Medicine

## 2013-02-15 ENCOUNTER — Encounter: Payer: Self-pay | Admitting: Family Medicine

## 2013-02-15 NOTE — Telephone Encounter (Signed)
An appt was made for next month. They will come and see me next week for a recheck

## 2013-02-15 NOTE — Telephone Encounter (Signed)
Called and spoke with patient and niece about Dr Patsy Lager wanting her to follow back up with her to re check THS only plus need to see her on this day Monday the 28th at 7:30

## 2013-03-09 ENCOUNTER — Ambulatory Visit: Payer: Medicare Other | Admitting: Internal Medicine

## 2013-03-09 DIAGNOSIS — Z0289 Encounter for other administrative examinations: Secondary | ICD-10-CM

## 2013-03-20 ENCOUNTER — Encounter (HOSPITAL_COMMUNITY): Payer: Self-pay | Admitting: Family Medicine

## 2013-03-20 ENCOUNTER — Emergency Department (HOSPITAL_COMMUNITY)
Admission: EM | Admit: 2013-03-20 | Discharge: 2013-03-20 | Disposition: A | Discharge status: Home / Self Care | Payer: Medicare Other | Attending: Emergency Medicine | Admitting: Emergency Medicine

## 2013-03-20 DIAGNOSIS — Z880 Allergy status to penicillin: Secondary | ICD-10-CM | POA: Insufficient documentation

## 2013-03-20 DIAGNOSIS — Z79899 Other long term (current) drug therapy: Secondary | ICD-10-CM | POA: Insufficient documentation

## 2013-03-20 DIAGNOSIS — M129 Arthropathy, unspecified: Secondary | ICD-10-CM | POA: Insufficient documentation

## 2013-03-20 DIAGNOSIS — Z7982 Long term (current) use of aspirin: Secondary | ICD-10-CM | POA: Insufficient documentation

## 2013-03-20 DIAGNOSIS — I10 Essential (primary) hypertension: Secondary | ICD-10-CM | POA: Insufficient documentation

## 2013-03-20 DIAGNOSIS — E876 Hypokalemia: Secondary | ICD-10-CM

## 2013-03-20 DIAGNOSIS — J069 Acute upper respiratory infection, unspecified: Secondary | ICD-10-CM | POA: Insufficient documentation

## 2013-03-20 LAB — CBC WITH DIFFERENTIAL/PLATELET
Eosinophils Absolute: 0.3 10*3/uL (ref 0.0–0.7)
Lymphs Abs: 1.7 10*3/uL (ref 0.7–4.0)
MCH: 29.1 pg (ref 26.0–34.0)
Neutrophils Relative %: 53 % (ref 43–77)
Platelets: 300 10*3/uL (ref 150–400)
RBC: 4.22 MIL/uL (ref 3.87–5.11)
WBC: 5.5 10*3/uL (ref 4.0–10.5)

## 2013-03-20 LAB — COMPREHENSIVE METABOLIC PANEL
AST: 19 U/L (ref 0–37)
BUN: 8 mg/dL (ref 6–23)
CO2: 27 mEq/L (ref 19–32)
Chloride: 103 mEq/L (ref 96–112)
Creatinine, Ser: 0.57 mg/dL (ref 0.50–1.10)
GFR calc non Af Amer: 83 mL/min — ABNORMAL LOW (ref >90)
Glucose, Bld: 84 mg/dL (ref 70–99)
Total Bilirubin: 0.9 mg/dL (ref 0.3–1.2)

## 2013-03-20 LAB — TSH: TSH: 0.016 u[IU]/mL — ABNORMAL LOW (ref 0.350–4.500)

## 2013-03-20 MED ORDER — POTASSIUM CHLORIDE CRYS ER 20 MEQ PO TBCR: 40.0000 meq | EXTENDED_RELEASE_TABLET | Freq: Every day | ORAL | Status: DC

## 2013-03-20 MED ORDER — POTASSIUM CHLORIDE CRYS ER 20 MEQ PO TBCR
40.0000 meq | EXTENDED_RELEASE_TABLET | Freq: Once | ORAL | Status: AC
  Administered 2013-03-20: 40 meq via ORAL
  Filled 2013-03-20: qty 2

## 2013-03-20 NOTE — ED Notes (Signed)
Per pt sts 2 months of cold symptoms, headache,cough.

## 2013-03-20 NOTE — ED Provider Notes (Signed)
History     CSN: [id number]  Arrival date & time 03/20/13  1044   First MD Initiated Contact with Patient 03/20/13 1120      Chief Complaint  Patient presents with  . Headache  . URI     HPI Patient's chief complaint is that of the last 2 months she wakes up in the morning feeling bad and run down.  She's had some upper respiratory symptoms but nothing severe.  Denies any definite fever.  She does take medication for high blood pressure.  She denies insomnia. Past Medical History  Diagnosis Date  . Hypertension   . Arthritis     History reviewed. No pertinent past surgical history.  History reviewed. No pertinent family history.  History  Substance Use Topics  . Smoking status: Never Smoker   . Smokeless tobacco: Not on file  . Alcohol Use: No    OB History   Grav Para Term Preterm Abortions TAB SAB Ect Mult Living                  Review of Systems All other systems reviewed and are negative Allergies  Anesthetics, amide and Penicillins  Home Medications   Current Outpatient Rx  Name  Route  Sig  Dispense  Refill  . acetaminophen (TYLENOL) 650 MG CR tablet   Oral   Take 650 mg by mouth every 8 (eight) hours as needed for pain.         Marland Kitchen amLODipine (NORVASC) 5 MG tablet   Oral   Take 5 mg by mouth daily.          Marland Kitchen aspirin EC 81 MG tablet   Oral   Take 81 mg by mouth daily.          . benazepril (LOTENSIN) 20 MG tablet   Oral   Take 20 mg by mouth daily.          . IRON PO   Oral   Take 1 tablet by mouth 2 (two) times daily.         . methimazole (TAPAZOLE) 10 MG tablet   Oral   Take 1 tablet (10 mg total) by mouth daily.   30 tablet   0   . pantoprazole (PROTONIX) 40 MG tablet   Oral   Take 40 mg by mouth every morning.          . Potassium (POTASSIMIN PO)   Oral   Take 1 tablet by mouth daily.         . sucralfate (CARAFATE) 1 G tablet   Oral   Take 1 g by mouth 3 (three) times daily as needed. ulcers         .  traMADol (ULTRAM) 50 MG tablet   Oral   Take 50 mg by mouth every 6 (six) hours as needed. pain         . potassium chloride SA (K-DUR,KLOR-CON) 20 MEQ tablet   Oral   Take 2 tablets (40 mEq total) by mouth daily.   14 tablet   0     BP 133/77  Pulse 59  Temp(Src) 98.6 F (37 C) (Oral)  Resp 18  Ht 5' (1.524 m)  Wt 110 lb (49.896 kg)  BMI 21.48 kg/m2  SpO2 96%  Physical Exam  Nursing note and vitals reviewed. Constitutional: She is oriented to person, place, and time. She appears well-developed and well-nourished. No distress.  HENT:  Head: Normocephalic and atraumatic.  Eyes: Pupils  are equal, round, and reactive to light.  Neck: Normal range of motion.  Cardiovascular: Normal rate and intact distal pulses.   Pulmonary/Chest: No respiratory distress.  Abdominal: Normal appearance. She exhibits no distension. There is no tenderness. There is no rebound.  Musculoskeletal: Normal range of motion.  Neurological: She is alert and oriented to person, place, and time. No cranial nerve deficit.  Skin: Skin is warm and dry. No rash noted.  Psychiatric: She has a normal mood and affect. Her behavior is normal.    ED Course  Procedures (including critical care time) Medications  potassium chloride SA (K-DUR,KLOR-CON) CR tablet 40 mEq (not administered)    Labs Reviewed  COMPREHENSIVE METABOLIC PANEL - Abnormal; Notable for the following:    Potassium 3.0 (*)    GFR calc non Af Amer 83 (*)    All other components within normal limits  CBC WITH DIFFERENTIAL - Abnormal; Notable for the following:    Eosinophils Relative 6 (*)    All other components within normal limits  URINALYSIS, ROUTINE W REFLEX MICROSCOPIC  TSH   No results found.   1. Hypokalemia       MDM          Nelia Shi, MD 03/20/13 1420

## 2013-03-20 NOTE — ED Notes (Signed)
Pt c/o fatigue, general cold symptoms, and weight loss for 2 mo. NAD at this time. Pt ambulatory. States she hasnt been eating well.

## 2013-04-20 ENCOUNTER — Emergency Department (HOSPITAL_COMMUNITY)
Admission: EM | Admit: 2013-04-20 | Discharge: 2013-04-21 | Disposition: A | Discharge status: Home / Self Care | Payer: Medicare Other | Attending: Emergency Medicine | Admitting: Emergency Medicine

## 2013-04-20 ENCOUNTER — Emergency Department (HOSPITAL_COMMUNITY): Payer: Medicare Other

## 2013-04-20 DIAGNOSIS — R1084 Generalized abdominal pain: Secondary | ICD-10-CM | POA: Insufficient documentation

## 2013-04-20 DIAGNOSIS — R109 Unspecified abdominal pain: Secondary | ICD-10-CM

## 2013-04-20 DIAGNOSIS — Z880 Allergy status to penicillin: Secondary | ICD-10-CM | POA: Insufficient documentation

## 2013-04-20 DIAGNOSIS — I10 Essential (primary) hypertension: Secondary | ICD-10-CM | POA: Insufficient documentation

## 2013-04-20 DIAGNOSIS — M129 Arthropathy, unspecified: Secondary | ICD-10-CM | POA: Insufficient documentation

## 2013-04-20 DIAGNOSIS — Z79899 Other long term (current) drug therapy: Secondary | ICD-10-CM | POA: Insufficient documentation

## 2013-04-20 DIAGNOSIS — R197 Diarrhea, unspecified: Secondary | ICD-10-CM | POA: Insufficient documentation

## 2013-04-20 DIAGNOSIS — R1110 Vomiting, unspecified: Secondary | ICD-10-CM | POA: Insufficient documentation

## 2013-04-20 DIAGNOSIS — Z7982 Long term (current) use of aspirin: Secondary | ICD-10-CM | POA: Insufficient documentation

## 2013-04-20 LAB — COMPREHENSIVE METABOLIC PANEL WITH GFR
ALT: 21 U/L (ref 0–35)
AST: 48 U/L — ABNORMAL HIGH (ref 0–37)
Albumin: 3.5 g/dL (ref 3.5–5.2)
Alkaline Phosphatase: 73 U/L (ref 39–117)
BUN: 14 mg/dL (ref 6–23)
CO2: 28 meq/L (ref 19–32)
Calcium: 9.5 mg/dL (ref 8.4–10.5)
Chloride: 102 meq/L (ref 96–112)
Creatinine, Ser: 0.93 mg/dL (ref 0.50–1.10)
GFR calc Af Amer: 64 mL/min — ABNORMAL LOW
GFR calc non Af Amer: 55 mL/min — ABNORMAL LOW
Glucose, Bld: 108 mg/dL — ABNORMAL HIGH (ref 70–99)
Potassium: 2.8 meq/L — ABNORMAL LOW (ref 3.5–5.1)
Sodium: 140 meq/L (ref 135–145)
Total Bilirubin: 0.5 mg/dL (ref 0.3–1.2)
Total Protein: 6.1 g/dL (ref 6.0–8.3)

## 2013-04-20 LAB — CBC WITH DIFFERENTIAL/PLATELET
Basophils Absolute: 0 K/uL (ref 0.0–0.1)
Basophils Relative: 0 % (ref 0–1)
Eosinophils Absolute: 0.5 K/uL (ref 0.0–0.7)
Eosinophils Relative: 6 % — ABNORMAL HIGH (ref 0–5)
HCT: 37.6 % (ref 36.0–46.0)
Hemoglobin: 12.5 g/dL (ref 12.0–15.0)
Lymphocytes Relative: 41 % (ref 12–46)
Lymphs Abs: 3.4 K/uL (ref 0.7–4.0)
MCH: 28.8 pg (ref 26.0–34.0)
MCHC: 33.2 g/dL (ref 30.0–36.0)
MCV: 86.6 fL (ref 78.0–100.0)
Monocytes Absolute: 0.8 K/uL (ref 0.1–1.0)
Monocytes Relative: 9 % (ref 3–12)
Neutro Abs: 3.6 K/uL (ref 1.7–7.7)
Neutrophils Relative %: 43 % (ref 43–77)
Platelets: 306 K/uL (ref 150–400)
RBC: 4.34 MIL/uL (ref 3.87–5.11)
RDW: 12.9 % (ref 11.5–15.5)
WBC: 8.4 K/uL (ref 4.0–10.5)

## 2013-04-20 NOTE — ED Notes (Signed)
Attempted to collect urine, pt unable to void.  

## 2013-04-20 NOTE — ED Provider Notes (Signed)
History    CSN: [id number] Arrival date & time 04/20/13  2148  First MD Initiated Contact with Patient 04/20/13 2223     Chief Complaint  Patient presents with  . Abdominal Pain   (Consider location/radiation/quality/duration/timing/severity/associated sxs/prior Treatment) HPI Currently pain-free but yesterday had a few hours of abdominal pain that resolved without associated symptoms, tonight had a couple hours of diffuse abdominal pain after eating a sandwich gradual onset gradually worsening then resolved completely after she vomited once upon arrival to the emergency department she also had one nonbloody loose stool at home prior to arrival tonight, there is no blood in the vomiting tonight, she had no chest pain no shortness breath no fever no rash no lightheadedness. She feels back to normal now.   Past Medical History  Diagnosis Date  . Hypertension   . Arthritis    No past surgical history on file. No family history on file. History  Substance Use Topics  . Smoking status: Never Smoker   . Smokeless tobacco: Not on file  . Alcohol Use: No   OB History   Grav Para Term Preterm Abortions TAB SAB Ect Mult Living                 Review of Systems 10 Systems reviewed and are negative for acute change except as noted in the HPI. Allergies  Anesthetics, amide and Penicillins  Home Medications   Current Outpatient Rx  Name  Route  Sig  Dispense  Refill  . acetaminophen (TYLENOL) 650 MG CR tablet   Oral   Take 650 mg by mouth every 8 (eight) hours as needed for pain.         Marland Kitchen amLODipine (NORVASC) 5 MG tablet   Oral   Take 5 mg by mouth daily.          Marland Kitchen aspirin EC 81 MG tablet   Oral   Take 81 mg by mouth daily.          . benazepril (LOTENSIN) 20 MG tablet   Oral   Take 20 mg by mouth daily.          . IRON PO   Oral   Take 1 tablet by mouth 2 (two) times daily.         . methimazole (TAPAZOLE) 10 MG tablet   Oral   Take 1 tablet (10 mg  total) by mouth daily.   30 tablet   0   . pantoprazole (PROTONIX) 40 MG tablet   Oral   Take 40 mg by mouth every morning.          . potassium chloride SA (K-DUR,KLOR-CON) 20 MEQ tablet   Oral   Take 40 mEq by mouth daily.         . sucralfate (CARAFATE) 1 G tablet   Oral   Take 1 g by mouth 3 (three) times daily as needed. ulcers         . traMADol (ULTRAM) 50 MG tablet   Oral   Take 50 mg by mouth every 6 (six) hours as needed. pain          BP 135/79  Pulse 71  Temp(Src) 98.3 F (36.8 C) (Oral)  Resp 16  SpO2 99% Physical Exam  Nursing note and vitals reviewed. Constitutional:  Awake, alert, nontoxic appearance.  HENT:  Head: Atraumatic.  Eyes: Right eye exhibits no discharge. Left eye exhibits no discharge.  Neck: Neck supple.  Cardiovascular: Normal rate and  regular rhythm.   No murmur heard. Pulmonary/Chest: Effort normal and breath sounds normal. No respiratory distress. She has no wheezes. She has no rales. She exhibits no tenderness.  Abdominal: Soft. There is no tenderness. There is no rebound.  Musculoskeletal: She exhibits no edema and no tenderness.  Baseline ROM, no obvious new focal weakness.  Neurological: She is alert.  Mental status and motor strength appears baseline for patient and situation.  Skin: No rash noted.  Psychiatric: She has a normal mood and affect.    ED Course  Procedures (including critical care time) ECG: Sinus rhythm, ventricular rate 67, normal axis, left ventricular hypertrophy, interventricular conduction delay, nonspecific ST and T changes, compared to February 2014 inverted T waves inferolaterally leads no longer present   Patient / Family / Caregiver understand and agree with initial ED impression and plan with expectations set for ED visit. Check labs and [place] Abd; dispo pending, endorsed to Dr. Lavella Lemons. 2330 Labs Reviewed  CBC WITH DIFFERENTIAL - Abnormal; Notable for the following:    Eosinophils Relative 6 (*)     All other components within normal limits  COMPREHENSIVE METABOLIC PANEL - Abnormal; Notable for the following:    Potassium 2.8 (*)    Glucose, Bld 108 (*)    AST 48 (*)    GFR calc non Af Amer 55 (*)    GFR calc Af Amer 64 (*)    All other components within normal limits  LIPASE, BLOOD  URINALYSIS, ROUTINE W REFLEX MICROSCOPIC  POCT I-STAT TROPONIN I  POCT I-STAT TROPONIN I  POCT I-STAT TROPONIN I   US Abdomen Complete  04/20/2013   *RADIOLOGY REPORT*  Clinical Data:  Abdominal pain, vomiting.  COMPLETE ABDOMINAL ULTRASOUND  Comparison:  None.  Findings:  Gallbladder:  Multiple small gallstones, the largest 6 mm.  These are mobile.  No wall thickening.  Negative sonographic Murphy's.  Common bile duct:   Normal for patient's age, 7 mm in diameter.  Liver:  No focal lesion identified.  Within normal limits in parenchymal echogenicity.  IVC:  Appears normal.  Pancreas:  No focal abnormality seen.  Spleen:  Within normal limits in size and echotexture.  Right Kidney:   Two cysts within the right kidney, the largest 3.4 cm inferiorly.  No hydronephrosis.  Normal echotexture.  Left Kidney:  Normal in size and parenchymal echogenicity.  No evidence of mass or hydronephrosis.  Abdominal aorta:  No aneurysm identified.  IMPRESSION: Cholelithiasis.  No sonographic evidence of acute cholecystitis.   Original Report Authenticated By: Charlett Nose, M.D.   1. Abdominal pain     MDM  Dispo pending.  Hurman Horn, MD 04/22/13 [phone]

## 2013-04-20 NOTE — ED Notes (Signed)
Per EMS: patient reports feeling bad x1 week. States tonight she is having abdominal pain with associated nausea/vomiting, diarrhea. Pt is diaphoretic. Pt seen pcp today, prescribed Protonix for GERD. Reports eating sandwich around 2000, reports abdominal pain started at 2100.

## 2013-04-21 LAB — URINALYSIS, ROUTINE W REFLEX MICROSCOPIC
Glucose, UA: NEGATIVE mg/dL
Hgb urine dipstick: NEGATIVE
Ketones, ur: NEGATIVE mg/dL
Protein, ur: NEGATIVE mg/dL
pH: 7.5 (ref 5.0–8.0)

## 2013-04-21 LAB — POCT I-STAT TROPONIN I
Troponin i, poc: 0.04 ng/mL (ref 0.00–0.08)
Troponin i, poc: 0.02 ng/mL (ref 0.00–0.08)

## 2013-04-21 MED ORDER — ACETAMINOPHEN 325 MG PO TABS
650.0000 mg | ORAL_TABLET | Freq: Once | ORAL | Status: AC
  Administered 2013-04-21: 650 mg via ORAL
  Filled 2013-04-21: qty 2

## 2013-04-21 NOTE — Progress Notes (Signed)
Chaplain Note: Found pt wondering around the hallway in the ED carrying her belongings.  Pt was wearing her night gown and stated she had been discharged but was trying to find her way back to her room since she did not have a ride.  I escorted pt to waiting room and sat and talked with her. Helped pt make phone calls and arrange for a ride home. .  I listened as she shared her story.   Pt very grateful for the support  Rutherford Nail Chaplain

## 2013-04-21 NOTE — ED Notes (Signed)
Pt d/c from room with breakfast. RN offered to call [facility] for transport home, but patient refused. Pt states she will wait for his son. Nurse first informed.

## 2013-07-27 ENCOUNTER — Emergency Department (HOSPITAL_COMMUNITY): Payer: Medicare Other

## 2013-07-27 ENCOUNTER — Encounter (HOSPITAL_COMMUNITY): Payer: Self-pay | Admitting: Adult Health

## 2013-07-27 ENCOUNTER — Emergency Department (HOSPITAL_COMMUNITY)
Admission: EM | Admit: 2013-07-27 | Discharge: 2013-07-27 | Disposition: A | Discharge status: Home / Self Care | Payer: Medicare Other | Attending: Emergency Medicine | Admitting: Emergency Medicine

## 2013-07-27 DIAGNOSIS — M25531 Pain in right wrist: Secondary | ICD-10-CM

## 2013-07-27 DIAGNOSIS — M25539 Pain in unspecified wrist: Secondary | ICD-10-CM | POA: Insufficient documentation

## 2013-07-27 DIAGNOSIS — M25439 Effusion, unspecified wrist: Secondary | ICD-10-CM | POA: Insufficient documentation

## 2013-07-27 DIAGNOSIS — M129 Arthropathy, unspecified: Secondary | ICD-10-CM | POA: Insufficient documentation

## 2013-07-27 DIAGNOSIS — Z7982 Long term (current) use of aspirin: Secondary | ICD-10-CM | POA: Insufficient documentation

## 2013-07-27 DIAGNOSIS — Z79899 Other long term (current) drug therapy: Secondary | ICD-10-CM | POA: Insufficient documentation

## 2013-07-27 DIAGNOSIS — Z880 Allergy status to penicillin: Secondary | ICD-10-CM | POA: Insufficient documentation

## 2013-07-27 DIAGNOSIS — I10 Essential (primary) hypertension: Secondary | ICD-10-CM | POA: Insufficient documentation

## 2013-07-27 DIAGNOSIS — R52 Pain, unspecified: Secondary | ICD-10-CM | POA: Insufficient documentation

## 2013-07-27 DIAGNOSIS — E876 Hypokalemia: Secondary | ICD-10-CM | POA: Insufficient documentation

## 2013-07-27 LAB — CBC
HCT: 34.2 % — ABNORMAL LOW (ref 36.0–46.0)
Hemoglobin: 11.6 g/dL — ABNORMAL LOW (ref 12.0–15.0)
MCH: 29.4 pg (ref 26.0–34.0)
MCHC: 33.9 g/dL (ref 30.0–36.0)
RBC: 3.94 MIL/uL (ref 3.87–5.11)

## 2013-07-27 LAB — POCT I-STAT, CHEM 8
BUN: 7 mg/dL (ref 6–23)
Calcium, Ion: 1.18 mmol/L (ref 1.13–1.30)
Chloride: 103 mEq/L (ref 96–112)
Creatinine, Ser: 0.6 mg/dL (ref 0.50–1.10)
Glucose, Bld: 105 mg/dL — ABNORMAL HIGH (ref 70–99)
Hemoglobin: 12.2 g/dL (ref 12.0–15.0)
Potassium: 2.9 mEq/L — ABNORMAL LOW (ref 3.5–5.1)

## 2013-07-27 MED ORDER — PREDNISONE 20 MG PO TABS: 60.0000 mg | ORAL_TABLET | Freq: Every day | ORAL | Status: DC

## 2013-07-27 MED ORDER — HYDROCODONE-ACETAMINOPHEN 5-325 MG PO TABS
1.0000 | ORAL_TABLET | Freq: Once | ORAL | Status: AC
  Administered 2013-07-27: 1 via ORAL
  Filled 2013-07-27: qty 1

## 2013-07-27 MED ORDER — ONDANSETRON HCL 4 MG/2ML IJ SOLN: 4.0000 mg | Freq: Once | INTRAMUSCULAR | Status: DC

## 2013-07-27 MED ORDER — FENTANYL CITRATE 0.05 MG/ML IJ SOLN: 50.0000 ug | INTRAMUSCULAR | Status: DC | PRN

## 2013-07-27 MED ORDER — PREDNISONE 20 MG PO TABS
60.0000 mg | ORAL_TABLET | Freq: Once | ORAL | Status: AC
  Administered 2013-07-27: 60 mg via ORAL
  Filled 2013-07-27: qty 3

## 2013-07-27 MED ORDER — HYDROCODONE-ACETAMINOPHEN 5-325 MG PO TABS: 1.0000 | ORAL_TABLET | Freq: Four times a day (QID) | ORAL | Status: DC | PRN

## 2013-07-27 MED ORDER — LIDOCAINE HCL (PF) 1 % IJ SOLN
5.0000 mL | Freq: Once | INTRAMUSCULAR | Status: DC
  Filled 2013-07-27: qty 5

## 2013-07-27 MED ORDER — POTASSIUM CHLORIDE CRYS ER 20 MEQ PO TBCR
40.0000 meq | EXTENDED_RELEASE_TABLET | Freq: Once | ORAL | Status: AC
  Administered 2013-07-27: 40 meq via ORAL
  Filled 2013-07-27: qty 2

## 2013-07-27 NOTE — ED Provider Notes (Signed)
CSN: [id number]     Arrival date & time 07/27/13  0107 History   First MD Initiated Contact with Patient 07/27/13 [phone]     Chief Complaint  Patient presents with  . Wrist Pain   (Consider location/radiation/quality/duration/timing/severity/associated sxs/prior Treatment) HPI History provided by patient. Right wrist pain and swelling started yesterday morning, worsening and severe tonight. Hurts with movement. Pain is so severe she was unable to sleep. No history of same. No fevers. No fall or trauma. No associated weakness or numbness. No rash. No history of gout.  Past Medical History  Diagnosis Date  . Hypertension   . Arthritis    History reviewed. No pertinent past surgical history. History reviewed. No pertinent family history. History  Substance Use Topics  . Smoking status: Never Smoker   . Smokeless tobacco: Not on file  . Alcohol Use: No   OB History   Grav Para Term Preterm Abortions TAB SAB Ect Mult Living                 Review of Systems  Constitutional: Negative for fever and chills.  HENT: Negative for neck pain and neck stiffness.   Eyes: Negative for pain.  Respiratory: Negative for shortness of breath.   Cardiovascular: Negative for chest pain.  Gastrointestinal: Negative for abdominal pain.  Genitourinary: Negative for dysuria.  Musculoskeletal: Positive for joint swelling.  Skin: Negative for rash.  Neurological: Negative for headaches.  All other systems reviewed and are negative.    Allergies  Anesthetics, amide and Penicillins  Home Medications   Current Outpatient Rx  Name  Route  Sig  Dispense  Refill  . acetaminophen (TYLENOL) 650 MG CR tablet   Oral   Take 650 mg by mouth every 8 (eight) hours as needed for pain.         Marland Kitchen amLODipine (NORVASC) 5 MG tablet   Oral   Take 5 mg by mouth daily.          Marland Kitchen aspirin EC 81 MG tablet   Oral   Take 81 mg by mouth daily.          . benazepril (LOTENSIN) 20 MG tablet   Oral   Take 20  mg by mouth daily.          . IRON PO   Oral   Take 1 tablet by mouth 2 (two) times daily.         . methimazole (TAPAZOLE) 10 MG tablet   Oral   Take 1 tablet (10 mg total) by mouth daily.   30 tablet   0   . pantoprazole (PROTONIX) 40 MG tablet   Oral   Take 40 mg by mouth every morning.          . potassium chloride SA (K-DUR,KLOR-CON) 20 MEQ tablet   Oral   Take 40 mEq by mouth daily.         . sucralfate (CARAFATE) 1 G tablet   Oral   Take 1 g by mouth 3 (three) times daily as needed. ulcers          BP 172/81  Pulse 65  Temp(Src) 98.3 F (36.8 C) (Oral)  Resp 20  SpO2 96% Physical Exam  Constitutional: She is oriented to person, place, and time. She appears well-developed and well-nourished.  HENT:  Head: Normocephalic and atraumatic.  Eyes: EOM are normal. Pupils are equal, round, and reactive to light.  Neck: Neck supple.  Cardiovascular: Normal rate, regular rhythm and  intact distal pulses.   Pulmonary/Chest: Effort normal and breath sounds normal. No respiratory distress.  Musculoskeletal:  Right upper extremity with tenderness and swelling over the wrist. Mild increased warmth to touch. No erythema. Decreased range of motion secondary to pain. Distal cap refill, sensorium, motor and pulses intact  Neurological: She is alert and oriented to person, place, and time.  Skin: Skin is warm and dry.    ED Course  Procedures (including critical care time) Labs Review Labs Reviewed  CBC - Abnormal; Notable for the following:    Hemoglobin 11.6 (*)    HCT 34.2 (*)    All other components within normal limits  POCT I-STAT, CHEM 8 - Abnormal; Notable for the following:    Potassium 2.9 (*)    Glucose, Bld 105 (*)    All other components within normal limits  BODY FLUID CULTURE  SYNOVIAL FLUID, CRYSTAL  SYNOVIAL CELL COUNT + DIFF, W/ CRYSTALS  URIC ACID   Imaging Review Dg Wrist Complete Right  07/27/2013   *RADIOLOGY REPORT*  Clinical Data:  Severe pain.  No injury.  RIGHT WRIST - COMPLETE 3+ VIEW  Comparison: None available at time of study interpretation.  Findings: No acute fracture deformity nor dislocation.  Severe radiocarpal joint space narrowing pararticular sclerosis.  Mild widening of scapholunate interval. Tiny bony fragments within the palmar aspect of the radial carpal joint space best seen on the lateral radiograph.  No destructive bony lesions.  Pararticular soft tissue planes are not suspicious.  IMPRESSION:  No acute fracture deformity nor dislocation.  Severe radiocarpal osteoarthrosis with mild widening of scapholunate interval, concerning for ligamentous injury which would better characterized on MRI if clinically indicated.  Tiny periarticular calcifications may reflect loose bodies or even sequelae of CPPD.   Original Report Authenticated By: Awilda Metro   Plan joint arthrocentesis - patient noted to have severe allergy to local anesthetics, amides. She is unable to recall exact allergy, but states it was severe and required ICU admission.   Case discussed with hand on-call, Dr. Mina Marble, he recommends topical anesthetic an attempt arthrocentesis, consider CT scan, in the absence of fever or leukocytosis doubts septic joint.  Right wrist arthrocentesis attempted. Betadine prep. Topical anesthetic applied. Multiple attempts without any fluid aspirated. Sterile dressing applied. Patient tolerated well.  Placed in splint. Plan d/c home, follow up PCP, uric acid pending.  PT agrees to strict return precautions for any fever, redness or worsening condition. RX provided. Plan conservative management.    MDM  Diagnosis right wrist arthritis, possible synovitis Imaging and labs as above Improved with medications VS and nursing notes reviewed.    Sunnie Nielsen, MD 07/27/13 [phone]

## 2013-07-27 NOTE — ED Notes (Addendum)
Presents with right wrist pain began this AM, started as an aching pain and has progressively worsened, denies injury, CMS intact, swelling to wrist noted.  Moving makes pain worse.

## 2013-12-31 ENCOUNTER — Encounter (HOSPITAL_COMMUNITY): Payer: Self-pay | Admitting: Emergency Medicine

## 2013-12-31 ENCOUNTER — Emergency Department (HOSPITAL_COMMUNITY)
Admission: EM | Admit: 2013-12-31 | Discharge: 2013-12-31 | Disposition: A | Discharge status: Home / Self Care | Payer: Medicare Other | Attending: Emergency Medicine | Admitting: Emergency Medicine

## 2013-12-31 ENCOUNTER — Emergency Department (HOSPITAL_COMMUNITY): Payer: Medicare Other

## 2013-12-31 DIAGNOSIS — Z79899 Other long term (current) drug therapy: Secondary | ICD-10-CM | POA: Insufficient documentation

## 2013-12-31 DIAGNOSIS — R519 Headache, unspecified: Secondary | ICD-10-CM

## 2013-12-31 DIAGNOSIS — Z7982 Long term (current) use of aspirin: Secondary | ICD-10-CM | POA: Insufficient documentation

## 2013-12-31 DIAGNOSIS — R42 Dizziness and giddiness: Secondary | ICD-10-CM | POA: Insufficient documentation

## 2013-12-31 DIAGNOSIS — M129 Arthropathy, unspecified: Secondary | ICD-10-CM | POA: Insufficient documentation

## 2013-12-31 DIAGNOSIS — I10 Essential (primary) hypertension: Secondary | ICD-10-CM | POA: Insufficient documentation

## 2013-12-31 DIAGNOSIS — R109 Unspecified abdominal pain: Secondary | ICD-10-CM | POA: Insufficient documentation

## 2013-12-31 DIAGNOSIS — H538 Other visual disturbances: Secondary | ICD-10-CM | POA: Insufficient documentation

## 2013-12-31 DIAGNOSIS — R51 Headache: Secondary | ICD-10-CM | POA: Insufficient documentation

## 2013-12-31 DIAGNOSIS — H539 Unspecified visual disturbance: Secondary | ICD-10-CM

## 2013-12-31 DIAGNOSIS — Z880 Allergy status to penicillin: Secondary | ICD-10-CM | POA: Insufficient documentation

## 2013-12-31 LAB — COMPREHENSIVE METABOLIC PANEL
ALK PHOS: 72 U/L (ref 39–117)
ALT: 11 U/L (ref 0–35)
AST: 21 U/L (ref 0–37)
Albumin: 3.8 g/dL (ref 3.5–5.2)
BUN: 13 mg/dL (ref 6–23)
CALCIUM: 9.9 mg/dL (ref 8.4–10.5)
CHLORIDE: 105 meq/L (ref 96–112)
CO2: 22 meq/L (ref 19–32)
Creatinine, Ser: 0.66 mg/dL (ref 0.50–1.10)
GFR calc Af Amer: >90 mL/min (ref >90)
GFR, EST NON AFRICAN AMERICAN: 79 mL/min — AB (ref >90)
Glucose, Bld: 88 mg/dL (ref 70–99)
POTASSIUM: 3.9 meq/L (ref 3.7–5.3)
SODIUM: 144 meq/L (ref 137–147)
Total Bilirubin: 0.7 mg/dL (ref 0.3–1.2)
Total Protein: 7 g/dL (ref 6.0–8.3)

## 2013-12-31 LAB — CBC WITH DIFFERENTIAL/PLATELET
BASOS ABS: 0 10*3/uL (ref 0.0–0.1)
Basophils Relative: 1 % (ref 0–1)
Eosinophils Absolute: 0.3 10*3/uL (ref 0.0–0.7)
Eosinophils Relative: 4 % (ref 0–5)
HCT: 40.3 % (ref 36.0–46.0)
Hemoglobin: 13.7 g/dL (ref 12.0–15.0)
LYMPHS ABS: 2 10*3/uL (ref 0.7–4.0)
LYMPHS PCT: 32 % (ref 12–46)
MCH: 29.7 pg (ref 26.0–34.0)
MCHC: 34 g/dL (ref 30.0–36.0)
MCV: 87.4 fL (ref 78.0–100.0)
Monocytes Absolute: 0.6 10*3/uL (ref 0.1–1.0)
Monocytes Relative: 10 % (ref 3–12)
NEUTROS ABS: 3.4 10*3/uL (ref 1.7–7.7)
NEUTROS PCT: 54 % (ref 43–77)
PLATELETS: 343 10*3/uL (ref 150–400)
RBC: 4.61 MIL/uL (ref 3.87–5.11)
RDW: 13.3 % (ref 11.5–15.5)
WBC: 6.3 10*3/uL (ref 4.0–10.5)

## 2013-12-31 LAB — TROPONIN I: Troponin I: <0.3 ng/mL (ref <0.30)

## 2013-12-31 LAB — SEDIMENTATION RATE: Sed Rate: 7 mm/hr (ref 0–22)

## 2013-12-31 MED ORDER — TRAMADOL HCL 50 MG PO TABS: 50.0000 mg | ORAL_TABLET | Freq: Four times a day (QID) | ORAL | Status: DC | PRN

## 2013-12-31 NOTE — ED Notes (Signed)
Patient transported to MRI 

## 2013-12-31 NOTE — ED Notes (Signed)
Received pt via EMS with c/o headache x 2 days. Pt seen at PMD for same and told her BP was elevated. Pt denies pain at present. Pt has dizziness when headache occurs.

## 2013-12-31 NOTE — Discharge Instructions (Signed)
Followup with your primary care Dr. in next few days. As you know the MRI was not complete but did show some mild enlargement of the pituitary discuss this with your primary care Dr. followup MRI in the future may be needed. Otherwise workup has been negative. Take Tylenol as needed for pain. Return for any new or worse symptoms.

## 2013-12-31 NOTE — ED Provider Notes (Signed)
CSN: [id number]     Arrival date & time 12/31/13  1127 History   First MD Initiated Contact with Patient 12/31/13 1319     Chief Complaint  Patient presents with  . Headache     (Consider location/radiation/quality/duration/timing/severity/associated sxs/prior Treatment) HPI Patient reports 2 days ago she had a sharp shooting pain on the right forehead that went across her head that only lasted a few seconds however she had blurred vision that lasted about 10 minutes afterwards. She went to see her PCP and had blood work ordered. She was told her blood pressure was high. She reports she had an episode yesterday, she did not have an episode today. She states when she has it she feels dizzy and lightheaded like she is going to pass out. She denies chest pain, shortness of breath, diaphoresis, nausea, and vomiting. She states she is constipated and has to take milk of magnesia daily. She states she's had upper abdominal pain that lasted 15 minutes twice. She denies diarrhea. Patient states she feels fine now although out in the waiting room she felt like she might pass out. She states she's never had this before  PCP Dr Sharyn LullHarwani  Past Medical History  Diagnosis Date  . Hypertension   . Arthritis    History reviewed. No pertinent past surgical history. History reviewed. No pertinent family history. History  Substance Use Topics  . Smoking status: Never Smoker   . Smokeless tobacco: Not on file  . Alcohol Use: No   Son lives with patient  OB History   Grav Para Term Preterm Abortions TAB SAB Ect Mult Living                 Review of Systems  All other systems reviewed and are negative.      Allergies  Anesthetics, amide and Penicillins  Home Medications   Current Outpatient Rx  Name  Route  Sig  Dispense  Refill  . acetaminophen (TYLENOL) 650 MG CR tablet   Oral   Take 650 mg by mouth every 8 (eight) hours as needed for pain.         Marland Kitchen. amLODipine (NORVASC) 5 MG  tablet   Oral   Take 5 mg by mouth daily.          Marland Kitchen. aspirin EC 81 MG tablet   Oral   Take 81 mg by mouth daily.          . benazepril (LOTENSIN) 20 MG tablet   Oral   Take 20 mg by mouth daily.          . IRON PO   Oral   Take 1 tablet by mouth daily.         . methimazole (TAPAZOLE) 10 MG tablet   Oral   Take 1 tablet (10 mg total) by mouth daily.   30 tablet   0   . potassium chloride SA (K-DUR,KLOR-CON) 20 MEQ tablet   Oral   Take 20 mEq by mouth daily.          BP 147/75  Pulse 67  Temp(Src) 97.6 F (36.4 C) (Oral)  Resp 18  SpO2 100%  Vital signs normal   Physical Exam  Nursing note and vitals reviewed. Constitutional: She is oriented to person, place, and time. She appears well-developed and well-nourished.  Non-toxic appearance. She does not appear ill. No distress.  HENT:  Head: Normocephalic and atraumatic.  Right Ear: External ear normal.  Left Ear: External ear  normal.  Nose: Nose normal. No mucosal edema or rhinorrhea.  Mouth/Throat: Oropharynx is clear and moist and mucous membranes are normal. No dental abscesses or uvula swelling.  When I palpate the patient's temporal arteries which were easily palpable and equal bilaterally she states it made her vision start to get bad.  Eyes: Conjunctivae and EOM are normal. Pupils are equal, round, and reactive to light.  14:37 Visual Acuity   Bilateral Near: 20 ; Bilateral Distance: 100 ; R Near: 20 ; R Distance: 100 ; L Near: 20 ; L Distance: 100    Neck: Normal range of motion and full passive range of motion without pain. Neck supple.  Cardiovascular: Normal rate, regular rhythm and normal heart sounds.  Exam reveals no gallop and no friction rub.   No murmur heard. Pulmonary/Chest: Effort normal and breath sounds normal. No respiratory distress. She has no wheezes. She has no rhonchi. She has no rales. She exhibits no tenderness and no crepitus.  Abdominal: Soft. Normal appearance and bowel  sounds are normal. She exhibits no distension. There is no tenderness. There is no rebound and no guarding.  Musculoskeletal: Normal range of motion. She exhibits no edema and no tenderness.  Moves all extremities well.   Neurological: She is alert and oriented to person, place, and time. She has normal strength. No cranial nerve deficit.  Skin: Skin is warm, dry and intact. No rash noted. No erythema. No pallor.  Psychiatric: She has a normal mood and affect. Her speech is normal and behavior is normal. Her mood appears not anxious.    ED Course  Procedures (including critical care time)  Discussed with radiologist suggests doing MRI brain without contrast.   16:30 patient turned over to Dr Deretha Emory to get MR results, EKG and troponin  Labs Review Results for orders placed during the hospital encounter of 12/31/13  CBC WITH DIFFERENTIAL      Result Value Ref Range   WBC 6.3  4.0 - 10.5 K/uL   RBC 4.61  3.87 - 5.11 MIL/uL   Hemoglobin 13.7  12.0 - 15.0 g/dL   HCT 47.8  29.5 - 62.1 %   MCV 87.4  78.0 - 100.0 fL   MCH 29.7  26.0 - 34.0 pg   MCHC 34.0  30.0 - 36.0 g/dL   RDW 30.8  65.7 - 84.6 %   Platelets 343  150 - 400 K/uL   Neutrophils Relative % 54  43 - 77 %   Neutro Abs 3.4  1.7 - 7.7 K/uL   Lymphocytes Relative 32  12 - 46 %   Lymphs Abs 2.0  0.7 - 4.0 K/uL   Monocytes Relative 10  3 - 12 %   Monocytes Absolute 0.6  0.1 - 1.0 K/uL   Eosinophils Relative 4  0 - 5 %   Eosinophils Absolute 0.3  0.0 - 0.7 K/uL   Basophils Relative 1  0 - 1 %   Basophils Absolute 0.0  0.0 - 0.1 K/uL  COMPREHENSIVE METABOLIC PANEL      Result Value Ref Range   Sodium 144  137 - 147 mEq/L   Potassium 3.9  3.7 - 5.3 mEq/L   Chloride 105  96 - 112 mEq/L   CO2 22  19 - 32 mEq/L   Glucose, Bld 88  70 - 99 mg/dL   BUN 13  6 - 23 mg/dL   Creatinine, Ser 9.62  0.50 - 1.10 mg/dL   Calcium 9.9  8.4 -  10.5 mg/dL   Total Protein 7.0  6.0 - 8.3 g/dL   Albumin 3.8  3.5 - 5.2 g/dL   AST 21  0 - 37  U/L   ALT 11  0 - 35 U/L   Alkaline Phosphatase 72  39 - 117 U/L   Total Bilirubin 0.7  0.3 - 1.2 mg/dL   GFR calc non Af Amer 79 (*) >90 mL/min   GFR calc Af Amer >90  >90 mL/min   Laboratory interpretation all normal except sedimentation rate pending    Imaging Review No results found.   EKG Interpretation None      MDM   Final diagnoses:  Headache  Visual disturbances    disposition pending   Devoria Albe, MD, Armando Gang     Ward Givens, MD 12/31/13 [id number]

## 2014-03-04 ENCOUNTER — Other Ambulatory Visit (HOSPITAL_COMMUNITY): Payer: Self-pay | Admitting: Cardiology

## 2014-03-04 DIAGNOSIS — R079 Chest pain, unspecified: Secondary | ICD-10-CM

## 2014-03-23 ENCOUNTER — Encounter (HOSPITAL_COMMUNITY)
Admission: RE | Admit: 2014-03-23 | Discharge: 2014-03-23 | Disposition: A | Discharge status: Home / Self Care | Payer: Medicare Other | Source: Ambulatory Visit | Attending: Cardiology | Admitting: Cardiology

## 2014-03-23 ENCOUNTER — Other Ambulatory Visit: Payer: Self-pay

## 2014-03-23 DIAGNOSIS — R079 Chest pain, unspecified: Secondary | ICD-10-CM | POA: Insufficient documentation

## 2014-03-23 MED ORDER — TECHNETIUM TC 99M SESTAMIBI GENERIC - CARDIOLITE
10.0000 | Freq: Once | INTRAVENOUS | Status: AC | PRN
  Administered 2014-03-23: 10 via INTRAVENOUS

## 2014-03-23 MED ORDER — REGADENOSON 0.4 MG/5ML IV SOLN
INTRAVENOUS | Status: AC
  Administered 2014-03-23: 0.4 mg
  Filled 2014-03-23: qty 5

## 2014-03-23 MED ORDER — TECHNETIUM TC 99M SESTAMIBI GENERIC - CARDIOLITE
30.0000 | Freq: Once | INTRAVENOUS | Status: AC | PRN
  Administered 2014-03-23: 30 via INTRAVENOUS

## 2014-03-23 NOTE — Progress Notes (Signed)
Pt states only allergy is needles

## 2014-08-01 IMAGING — CR DG CHEST 2V
2 series · 2 of 2 positions shown · non-contrast
Comparison: Portable chest x-ray 06/05/2011.  Two-view chest x-ray
10/03/2008.

CLINICAL DATA: Dizziness.

CHEST - 2 VIEW

[w chest pa]
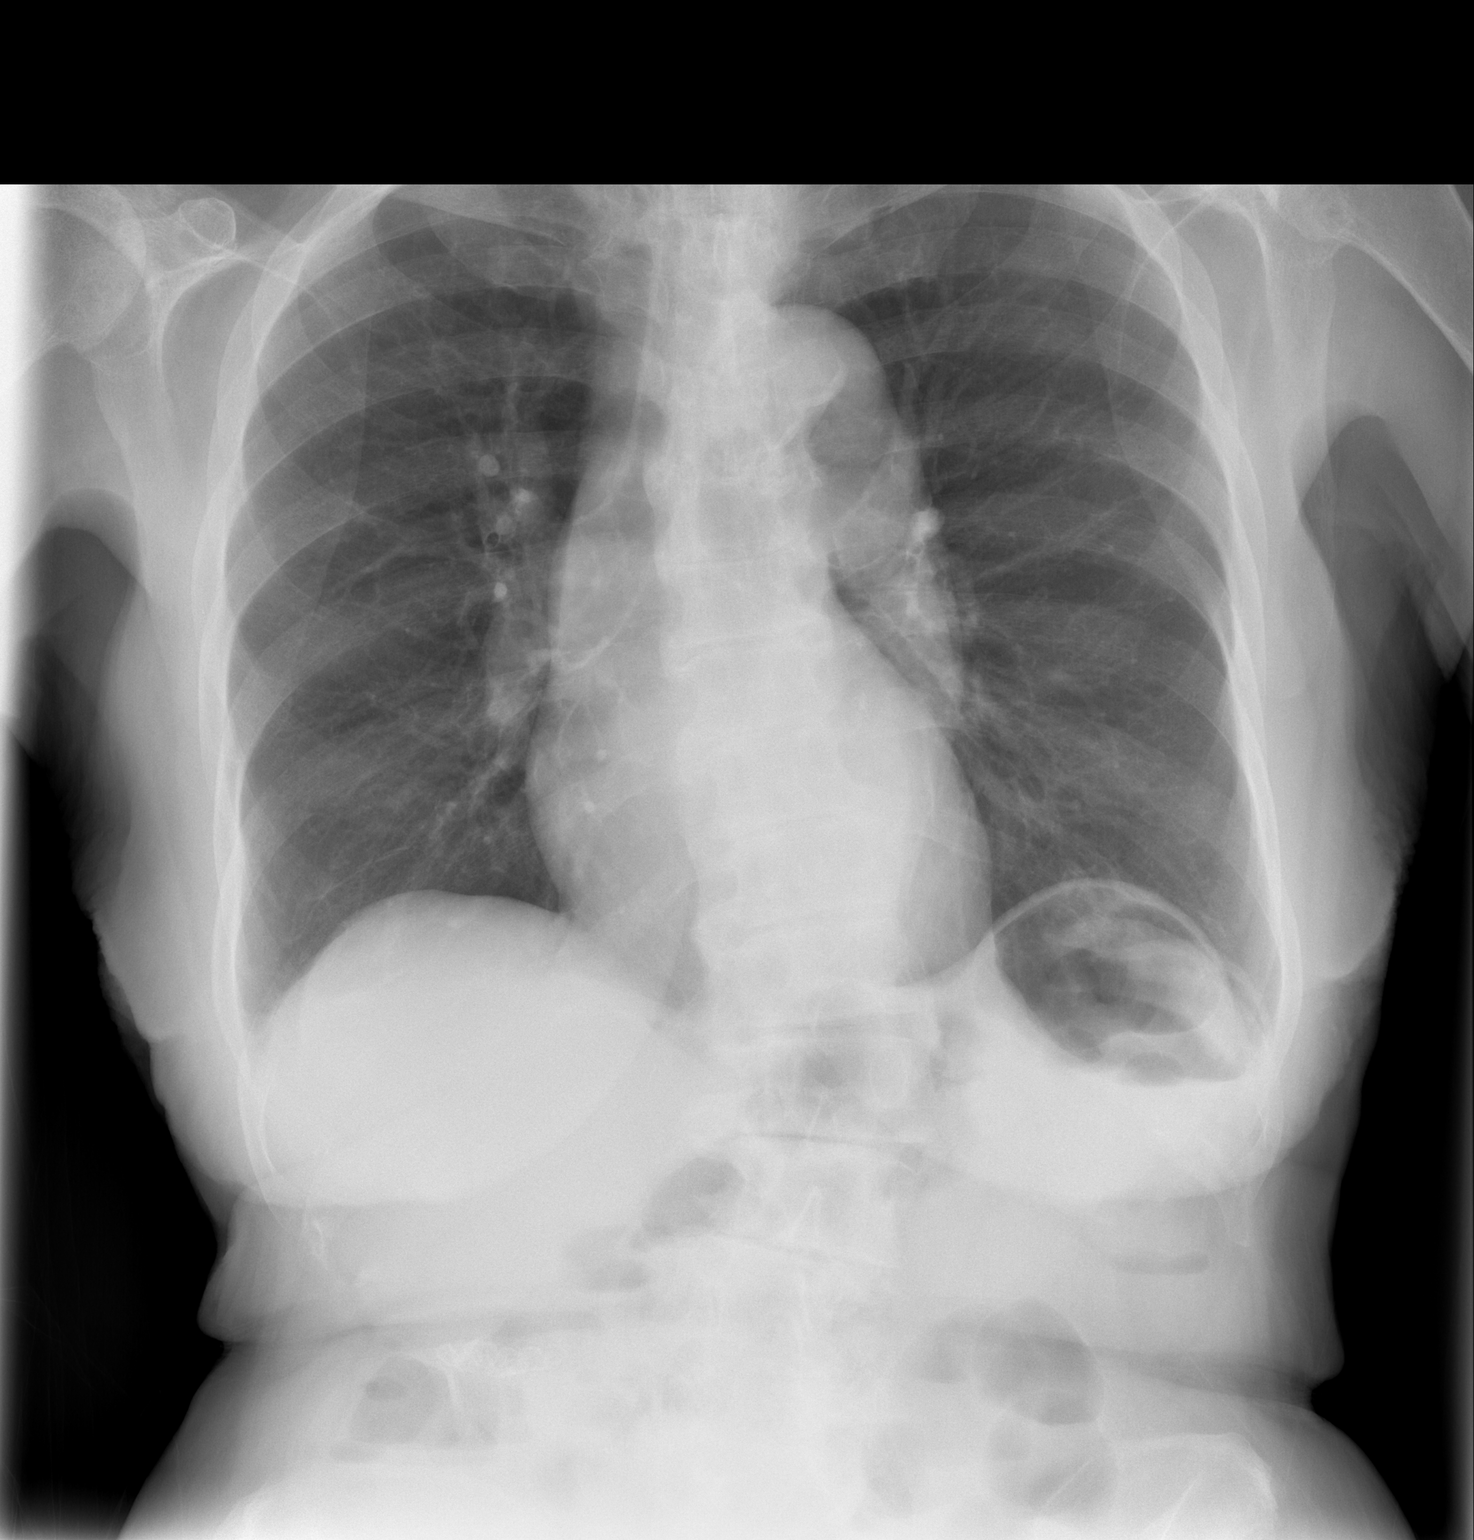

[w chest lat]
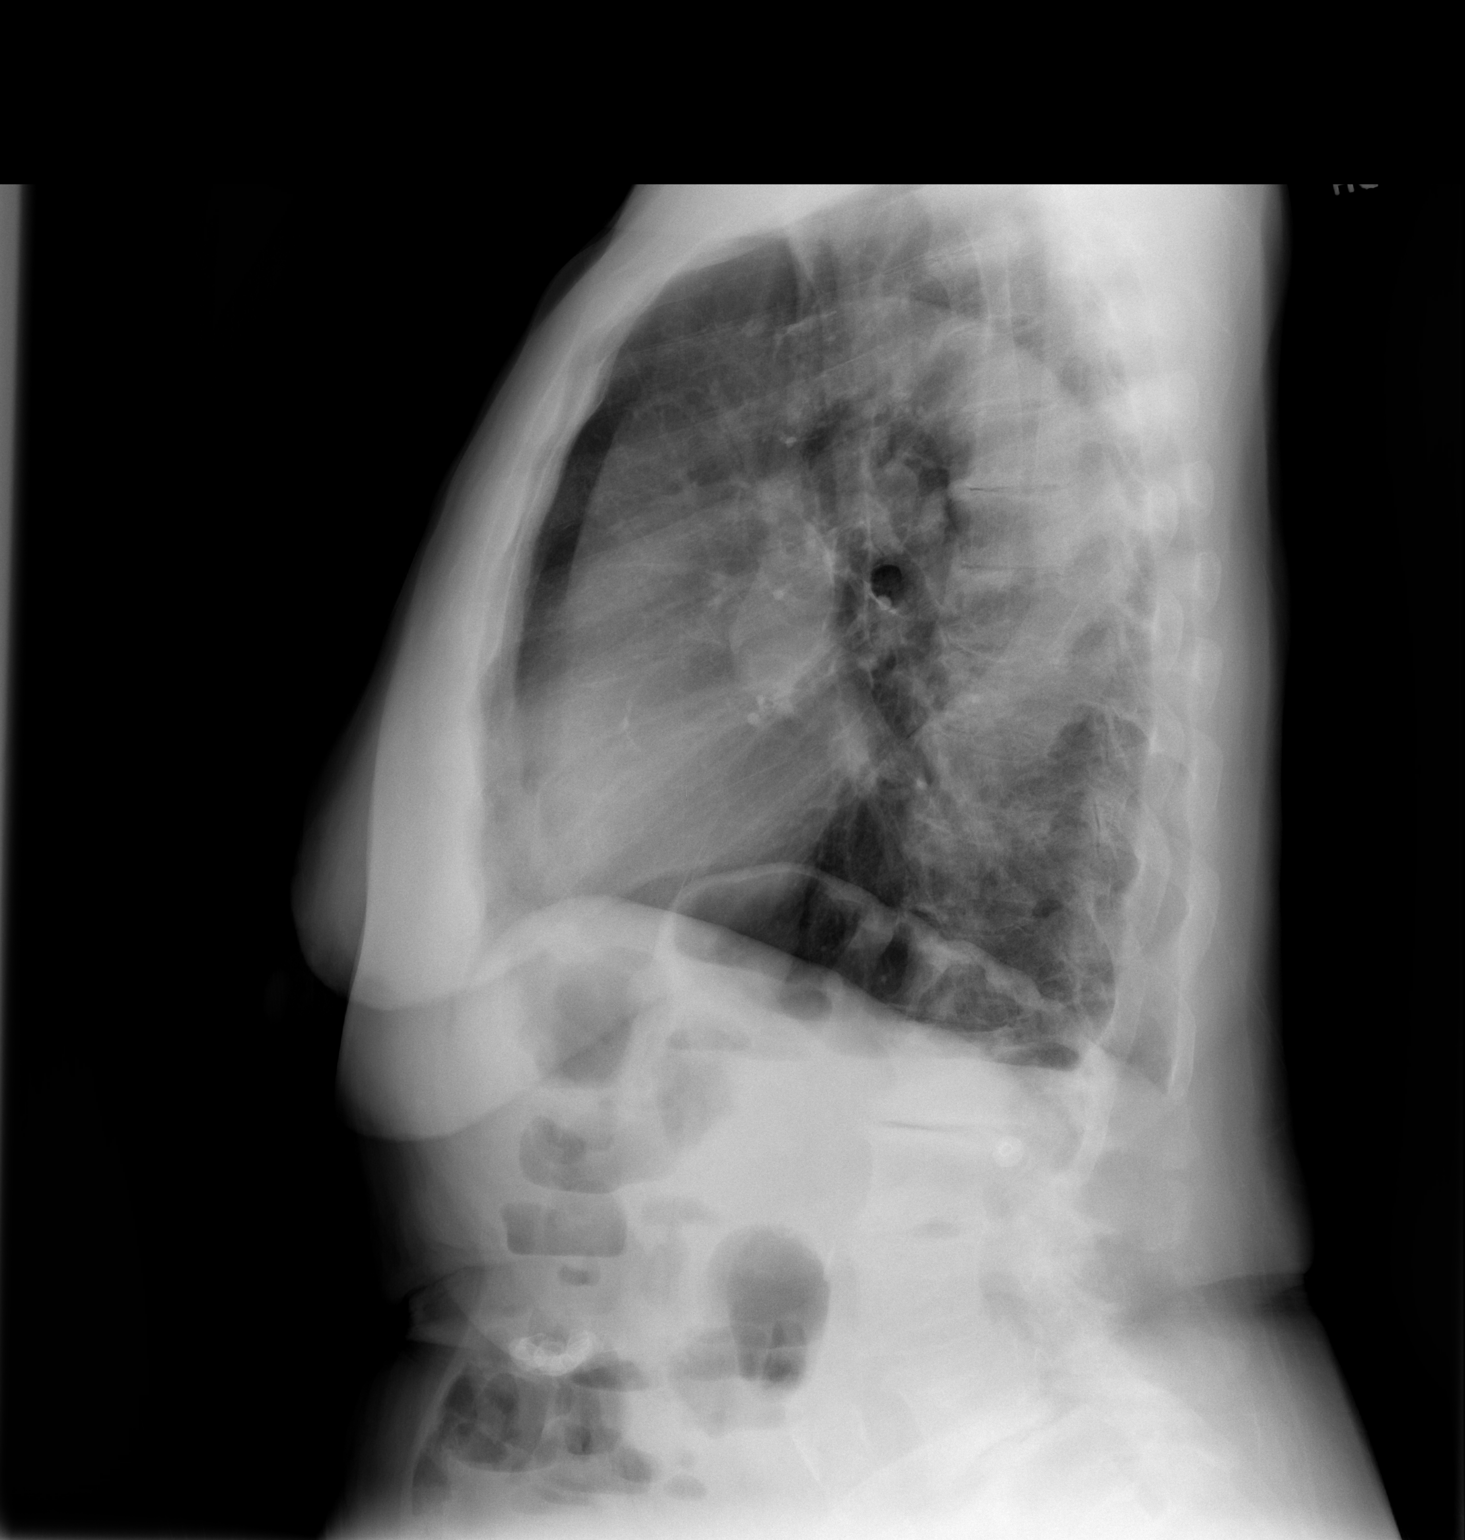

[2 of 2 positions shown; findings below may reference images not displayed]

FINDINGS: Cardiac silhouette normal in size, unchanged.  Thoracic
aorta tortuous and mildly atherosclerotic, unchanged.  Hilar and
mediastinal contours otherwise unremarkable.  Lungs clear.
Bronchovascular markings normal.  Pulmonary vascularity normal.  No
pneumothorax.  No pleural effusions.  Degenerative changes
throughout the thoracic spine.  No significant interval change.
IMPRESSION: No acute cardiopulmonary disease.  Stable examination.

## 2015-02-03 DIAGNOSIS — I2510 Atherosclerotic heart disease of native coronary artery without angina pectoris: Secondary | ICD-10-CM | POA: Diagnosis not present

## 2015-02-03 DIAGNOSIS — K219 Gastro-esophageal reflux disease without esophagitis: Secondary | ICD-10-CM | POA: Diagnosis not present

## 2015-02-03 DIAGNOSIS — R42 Dizziness and giddiness: Secondary | ICD-10-CM | POA: Diagnosis not present

## 2015-02-03 DIAGNOSIS — I10 Essential (primary) hypertension: Secondary | ICD-10-CM | POA: Diagnosis not present

## 2015-02-03 DIAGNOSIS — E785 Hyperlipidemia, unspecified: Secondary | ICD-10-CM | POA: Diagnosis not present

## 2015-02-16 IMAGING — US US SOFT TISSUE HEAD/NECK
1 series · 14 of 25 positions shown · non-contrast
Comparison: None.

CLINICAL DATA: Nodules on physical exam.

THYROID ULTRASOUND
TECHNIQUE: Ultrasound examination of the thyroid gland and adjacent
soft tissues was performed.

[Series 1: us soft tissue head/neck · 0.06mm/px · 14 of 80 slices shown]
[im 1/80]
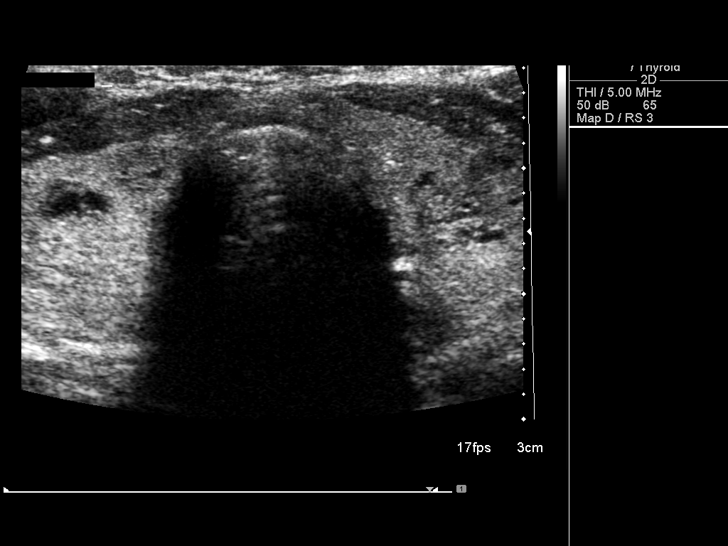
[im 7/80]
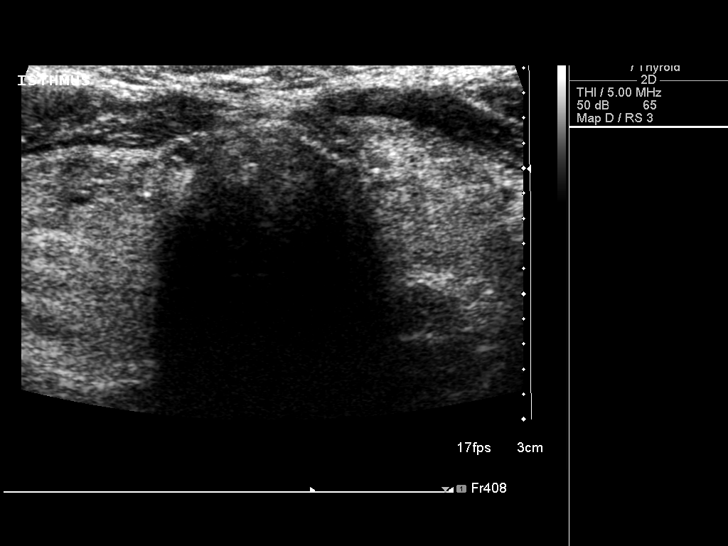
[im 14/80]
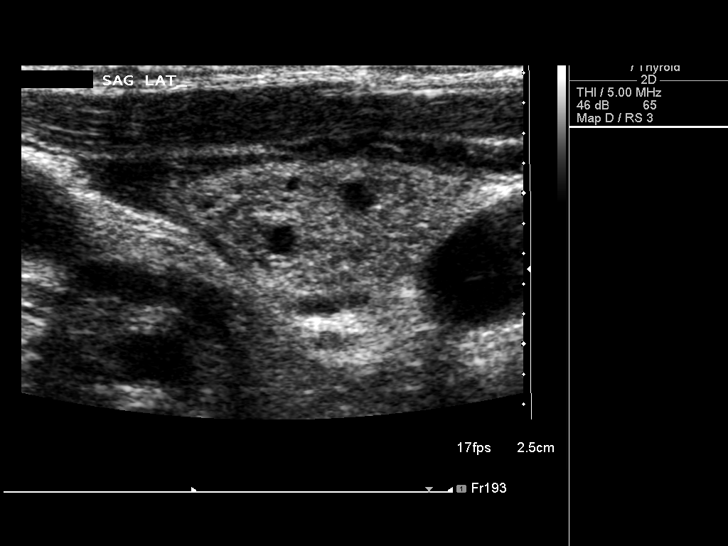
[im 20/80]
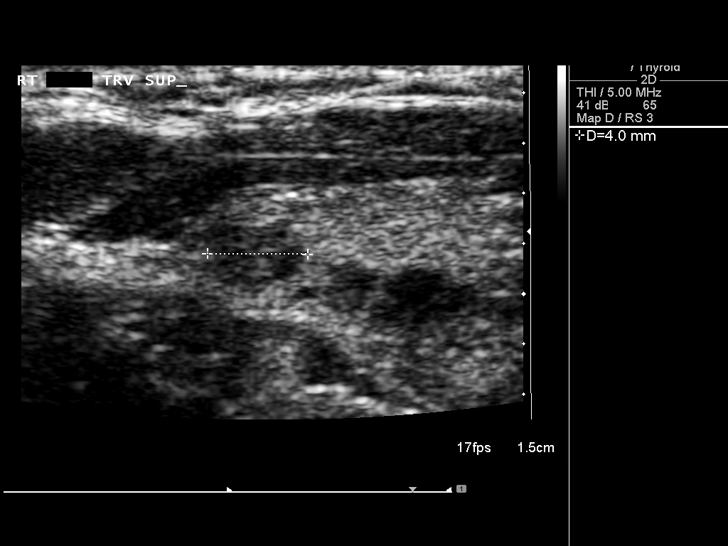
[im 27/80]
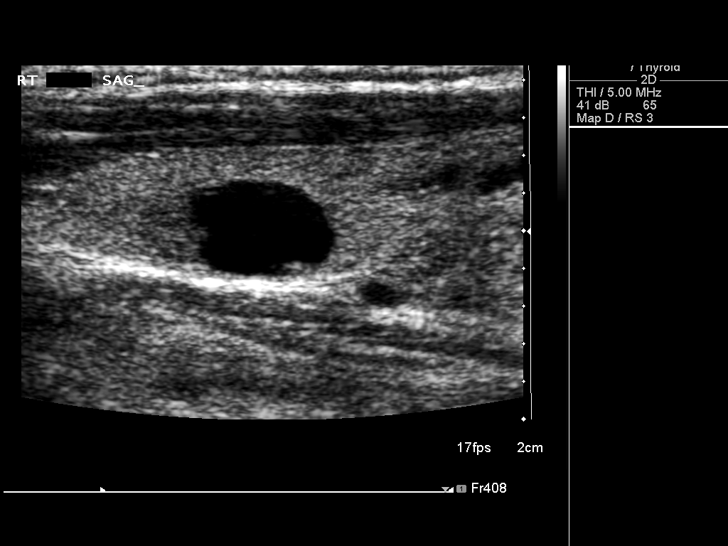
[im 30/80]
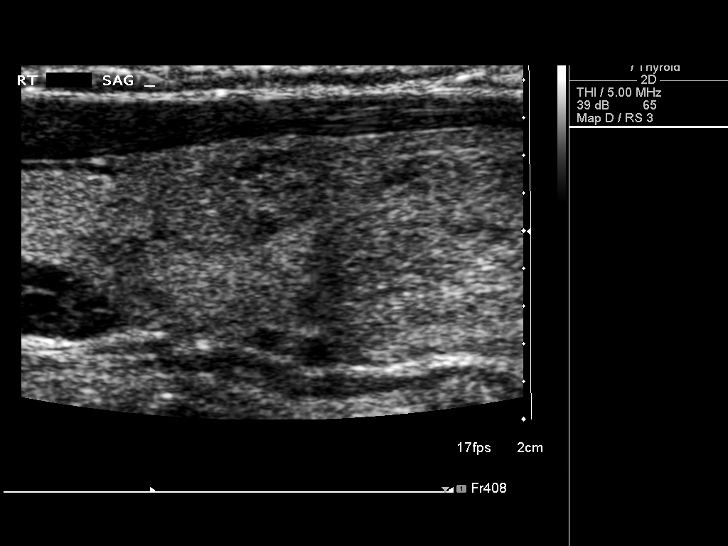
[im 37/80]
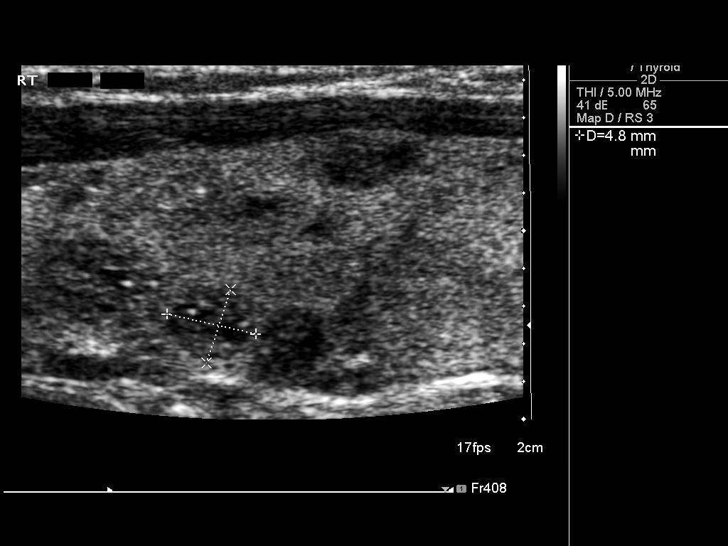
[im 43/80]
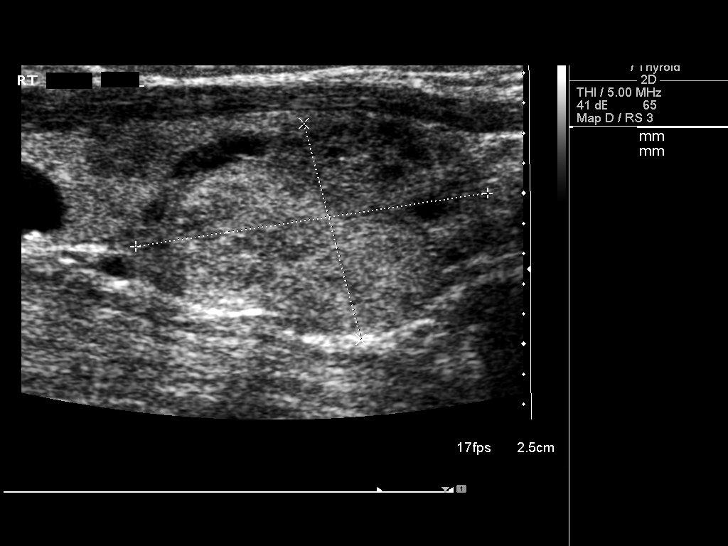
[im 50/80]
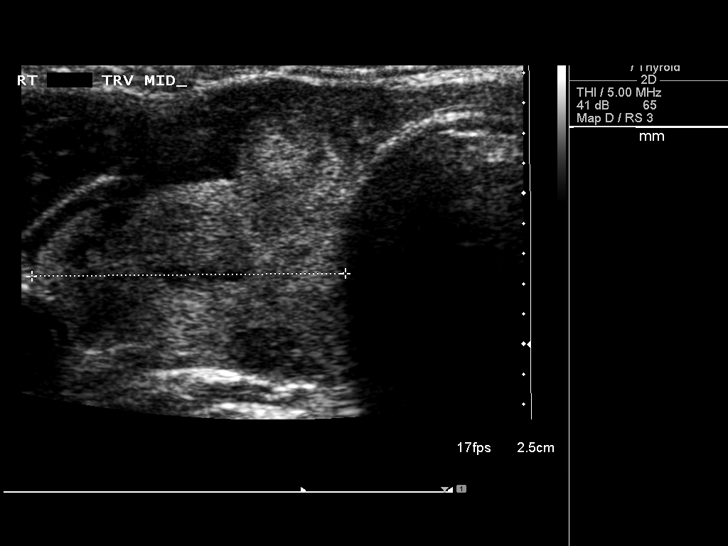
[im 53/80]
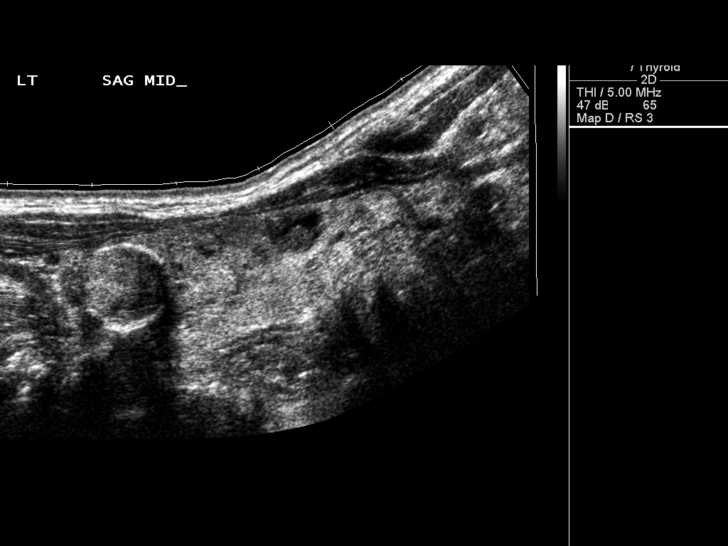
[im 60/80]
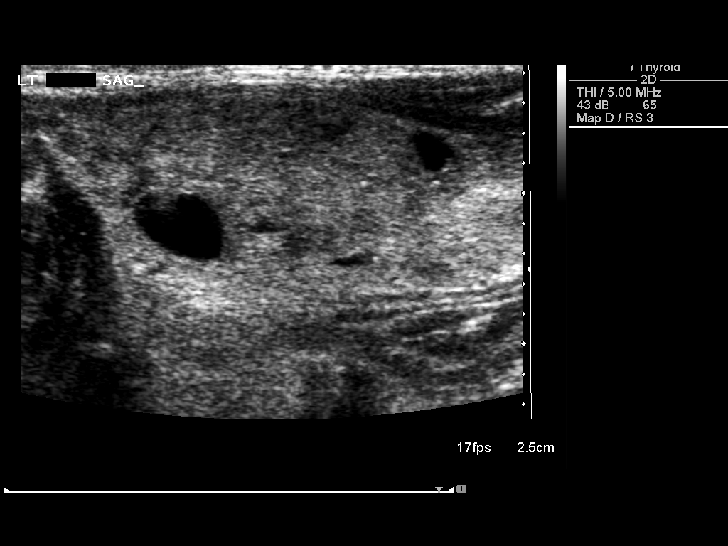
[im 66/80]
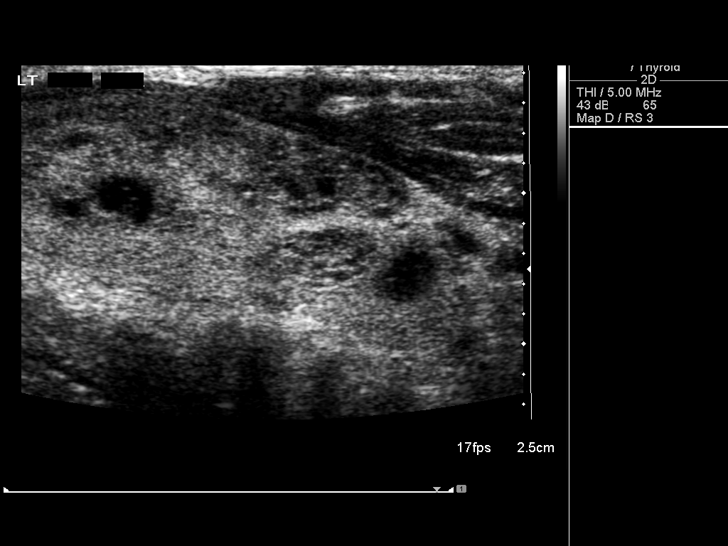
[im 73/80]
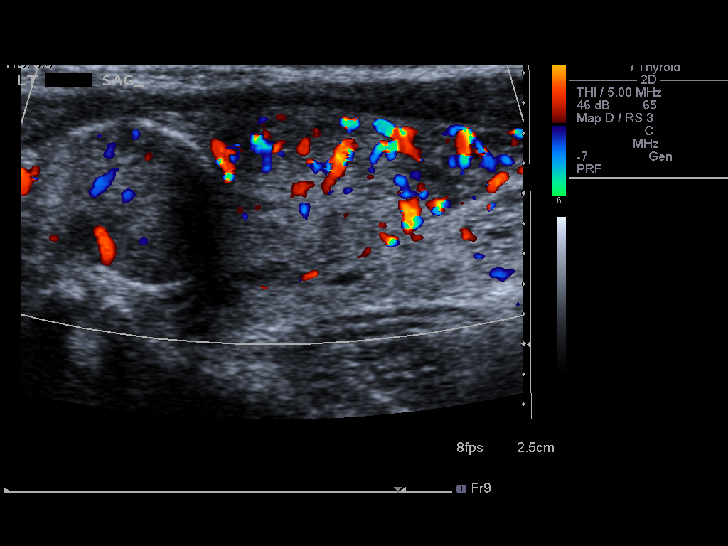
[im 80/80]
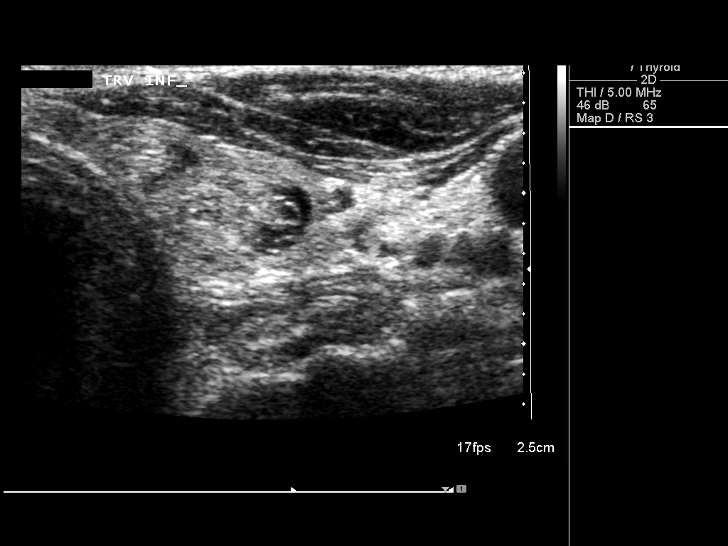

[14 of 25 positions shown; findings below may reference images not displayed]

FINDINGS: Right thyroid lobe:  Measures 4.5 x 1.6 x 2.1 cm, heterogeneous in
echotexture.
Left thyroid lobe:  Measures 5.1 x 1.5 x 1.8 cm, heterogeneous in
echotexture.
Isthmus:  Measures 3 mm.

Focal nodules:

Dominant complex nodule, right lower pole, 2.4 x 1.5 x 2.1 cm
(image 44).

Dominant cystic and solid nodule, left mid pole, 2.3 x 1.2 x 1.9 cm
(image 62).

There are multiple additional nodules in the thyroid bilaterally,
ranging from complex to solid in appearance and measuring at 1.4 cm
or less in size.

Lymphadenopathy:  None visualized.
IMPRESSION: Multiple bilateral thyroid nodules with dominant lesions discussed
above. Findings meet consensus criteria for biopsy.  Ultrasound-
guided fine needle aspiration should be considered, as per the
consensus statement: Management of Thyroid Nodules Detected at US:
Society of Radiologists in Ultrasound Consensus Conference

## 2015-03-10 ENCOUNTER — Other Ambulatory Visit: Payer: Self-pay | Admitting: Cardiology

## 2015-03-10 DIAGNOSIS — I2510 Atherosclerotic heart disease of native coronary artery without angina pectoris: Secondary | ICD-10-CM | POA: Diagnosis not present

## 2015-03-10 DIAGNOSIS — I209 Angina pectoris, unspecified: Secondary | ICD-10-CM | POA: Diagnosis not present

## 2015-03-10 DIAGNOSIS — K219 Gastro-esophageal reflux disease without esophagitis: Secondary | ICD-10-CM | POA: Diagnosis not present

## 2015-03-10 DIAGNOSIS — I10 Essential (primary) hypertension: Secondary | ICD-10-CM | POA: Diagnosis not present

## 2015-03-10 DIAGNOSIS — R079 Chest pain, unspecified: Secondary | ICD-10-CM

## 2015-03-10 DIAGNOSIS — I252 Old myocardial infarction: Secondary | ICD-10-CM | POA: Diagnosis not present

## 2015-03-12 ENCOUNTER — Other Ambulatory Visit (HOSPITAL_COMMUNITY): Payer: Self-pay

## 2015-03-12 ENCOUNTER — Emergency Department (HOSPITAL_COMMUNITY)
Admission: EM | Admit: 2015-03-12 | Discharge: 2015-03-12 | Disposition: A | Discharge status: Home / Self Care | Payer: Medicare Other | Attending: Emergency Medicine | Admitting: Emergency Medicine

## 2015-03-12 ENCOUNTER — Emergency Department (HOSPITAL_COMMUNITY): Payer: Medicare Other

## 2015-03-12 ENCOUNTER — Encounter (HOSPITAL_COMMUNITY): Payer: Self-pay | Admitting: Emergency Medicine

## 2015-03-12 DIAGNOSIS — R002 Palpitations: Secondary | ICD-10-CM | POA: Diagnosis present

## 2015-03-12 DIAGNOSIS — Z880 Allergy status to penicillin: Secondary | ICD-10-CM | POA: Insufficient documentation

## 2015-03-12 DIAGNOSIS — R5383 Other fatigue: Secondary | ICD-10-CM | POA: Insufficient documentation

## 2015-03-12 DIAGNOSIS — I10 Essential (primary) hypertension: Secondary | ICD-10-CM | POA: Insufficient documentation

## 2015-03-12 DIAGNOSIS — R42 Dizziness and giddiness: Secondary | ICD-10-CM | POA: Insufficient documentation

## 2015-03-12 DIAGNOSIS — Z79899 Other long term (current) drug therapy: Secondary | ICD-10-CM | POA: Insufficient documentation

## 2015-03-12 DIAGNOSIS — M1990 Unspecified osteoarthritis, unspecified site: Secondary | ICD-10-CM | POA: Diagnosis not present

## 2015-03-12 DIAGNOSIS — J9811 Atelectasis: Secondary | ICD-10-CM | POA: Diagnosis not present

## 2015-03-12 DIAGNOSIS — R030 Elevated blood-pressure reading, without diagnosis of hypertension: Secondary | ICD-10-CM | POA: Diagnosis not present

## 2015-03-12 DIAGNOSIS — Z7982 Long term (current) use of aspirin: Secondary | ICD-10-CM | POA: Insufficient documentation

## 2015-03-12 LAB — URINALYSIS, ROUTINE W REFLEX MICROSCOPIC
Bilirubin Urine: NEGATIVE
Glucose, UA: NEGATIVE mg/dL
Hgb urine dipstick: NEGATIVE
Ketones, ur: NEGATIVE mg/dL
Leukocytes, UA: NEGATIVE
Nitrite: NEGATIVE
Protein, ur: NEGATIVE mg/dL
Specific Gravity, Urine: 1.014 (ref 1.005–1.030)
Urobilinogen, UA: 1 mg/dL (ref 0.0–1.0)
pH: 6.5 (ref 5.0–8.0)

## 2015-03-12 LAB — CBC
HEMATOCRIT: 41.1 % (ref 36.0–46.0)
Hemoglobin: 13.4 g/dL (ref 12.0–15.0)
MCH: 28.2 pg (ref 26.0–34.0)
MCHC: 32.6 g/dL (ref 30.0–36.0)
MCV: 86.5 fL (ref 78.0–100.0)
PLATELETS: 326 10*3/uL (ref 150–400)
RBC: 4.75 MIL/uL (ref 3.87–5.11)
RDW: 13.1 % (ref 11.5–15.5)
WBC: 6.7 10*3/uL (ref 4.0–10.5)

## 2015-03-12 LAB — BASIC METABOLIC PANEL
Anion gap: 10 (ref 5–15)
BUN: 9 mg/dL (ref 6–20)
CO2: 27 mmol/L (ref 22–32)
CREATININE: 0.72 mg/dL (ref 0.44–1.00)
Calcium: 9.8 mg/dL (ref 8.9–10.3)
Chloride: 106 mmol/L (ref 101–111)
GFR calc Af Amer: >60 mL/min (ref >60)
GLUCOSE: 91 mg/dL (ref 65–99)
Potassium: 4 mmol/L (ref 3.5–5.1)
Sodium: 143 mmol/L (ref 135–145)

## 2015-03-12 LAB — HEPATIC FUNCTION PANEL
ALBUMIN: 3.8 g/dL (ref 3.5–5.0)
ALT: 9 U/L — ABNORMAL LOW (ref 14–54)
AST: 19 U/L (ref 15–41)
Alkaline Phosphatase: 72 U/L (ref 38–126)
BILIRUBIN INDIRECT: 0.6 mg/dL (ref 0.3–0.9)
Bilirubin, Direct: 0.2 mg/dL (ref 0.1–0.5)
Total Bilirubin: 0.8 mg/dL (ref 0.3–1.2)
Total Protein: 7.1 g/dL (ref 6.5–8.1)

## 2015-03-12 LAB — I-STAT TROPONIN, ED: Troponin i, poc: 0 ng/mL (ref 0.00–0.08)

## 2015-03-12 LAB — LIPASE, BLOOD: Lipase: 26 U/L (ref 22–51)

## 2015-03-12 NOTE — ED Notes (Signed)
Tolerating ambulation in the hallway.

## 2015-03-12 NOTE — ED Provider Notes (Signed)
CSN: [id number]     Arrival date & time 03/12/15  1417 History   First MD Initiated Contact with Patient 03/12/15 1430     Chief Complaint  Patient presents with  . Palpitations  . Fatigue   HPI   81 year old female presents today with 5 days of dizziness upon standing. Patient reports that upon waking 5 days ago she had a "funny feeling" in her head upon standing and felt as she needed to sit down or she would fall. Patient reports this continued through the remainder of the week, with only episodes upon standing. Patient denies associated shortness of breath, chest pain, palpitations, nausea, vomiting. She states that she was seen by her cardiologist Dr. Sharyn LullHarwani 3 days ago for this. She produced 2 prescriptions showing CBC, BMP, TSH; the other for prescription for nitroglycerin. Patient reports that she was unaware what the medications were and what to do with them. She states that she continued to have these "funny feeling" throughout the weekend and up till today. During my exam patient continued to deny any chest pain, shortness of breath, palpitations, lower extremity swelling or edema, abdominal pain, headache, dizziness while sitting in the exam bed. She denies recent history of illness including fever, nausea, vomiting, diarrhea, or dark stools. She denies any changes in her urinary color characteristics or frequency. She states she is taking her at home medications as directed. Nursing note reports EMS reporting  palpitations along with the dizziness this morning; also notes 1 episode of vomiting here in the ED. Patient adamantly denied both the chest palpitations and the vomiting episode.    Past Medical History  Diagnosis Date  . Hypertension   . Arthritis    History reviewed. No pertinent past surgical history. History reviewed. No pertinent family history. History  Substance Use Topics  . Smoking status: Never Smoker   . Smokeless tobacco: Not on file  . Alcohol Use: No   OB  History    No data available     Review of Systems  All other systems reviewed and are negative.   Allergies  Anesthetics, amide and Penicillins  Home Medications   Prior to Admission medications   Medication Sig Start Date End Date Taking? Authorizing Provider  acetaminophen (TYLENOL) 650 MG CR tablet Take 650 mg by mouth every 8 (eight) hours as needed for pain.    Historical Provider, MD  amLODipine (NORVASC) 5 MG tablet Take 5 mg by mouth daily.     Historical Provider, MD  aspirin EC 81 MG tablet Take 81 mg by mouth daily.     Historical Provider, MD  benazepril (LOTENSIN) 20 MG tablet Take 20 mg by mouth daily.     Historical Provider, MD  IRON PO Take 1 tablet by mouth daily.    Historical Provider, MD  methimazole (TAPAZOLE) 10 MG tablet Take 1 tablet (10 mg total) by mouth daily. 12/17/12   Gwenlyn FoundJessica C Copland, MD  potassium chloride SA (K-DUR,KLOR-CON) 20 MEQ tablet Take 20 mEq by mouth daily.    Historical Provider, MD  traMADol (ULTRAM) 50 MG tablet Take 1 tablet (50 mg total) by mouth every 6 (six) hours as needed. 12/31/13   Vanetta MuldersScott Zackowski, MD   BP 176/82 mmHg  Pulse 83  Temp(Src) 99 F (37.2 C) (Oral)  Resp 17  Ht 5\' 6"  (1.676 m)  SpO2 99% Physical Exam  Constitutional: She is oriented to person, place, and time. She appears well-developed and well-nourished.  HENT:  Head: Normocephalic and  atraumatic.  Eyes: Pupils are equal, round, and reactive to light.  Neck: Normal range of motion. Neck supple. No JVD present. No tracheal deviation present. No thyromegaly present.  Cardiovascular: Normal rate, regular rhythm, normal heart sounds and intact distal pulses.  Exam reveals no gallop and no friction rub.   No murmur heard. Pulmonary/Chest: Effort normal and breath sounds normal. No stridor. No respiratory distress. She has no wheezes. She has no rales. She exhibits no tenderness.  Abdominal: Soft. She exhibits no mass. There is no tenderness. There is no rebound and  no guarding.  Musculoskeletal: Normal range of motion.  Lymphadenopathy:    She has no cervical adenopathy.  Neurological: She is alert and oriented to person, place, and time. She has normal strength. No cranial nerve deficit or sensory deficit. She displays a negative Romberg sign. Coordination and gait normal. GCS eye subscore is 4. GCS verbal subscore is 5. GCS motor subscore is 6.  Skin: Skin is warm and dry.  Psychiatric: She has a normal mood and affect. Her behavior is normal. Judgment and thought content normal.  Nursing note and vitals reviewed.   ED Course  Procedures (including critical care time) Labs Review Labs Reviewed  CBC  BASIC METABOLIC PANEL  URINALYSIS, ROUTINE W REFLEX MICROSCOPIC  LIPASE, BLOOD  HEPATIC FUNCTION PANEL  Rosezena Sensor, ED    Imaging Review Dg Chest Port 1 View  03/12/2015   CLINICAL DATA:  History of hypertension. Patient complains of funny feeling in her mid chest.  EXAM: PORTABLE CHEST - 1 VIEW  COMPARISON:  December 07, 2012  FINDINGS: The heart size and mediastinal contours are within normal limits. The aorta is tortuous. There is mild atelectasis of left lung base. There is no focal infiltrate, pulmonary edema, or pleural effusion. The visualized skeletal structures are unremarkable.  IMPRESSION: Mild atelectasis of left lung base.  No focal pneumonia noted.   Electronically Signed   By: Sherian Rein M.D.   On: 03/12/2015 15:18    EKG Interpretation-    Sinus rhythm LAE, consider biatrial enlargement Anterior infarct, old Borderline repolarization abnormality When compared with ECG of 04/20/2013 No significant change was found Confirmed by [facility] MD, Nicholos Johns    MDM   Final diagnoses:  Dizziness    Labs: Troponin, lipase, hepatic function, urinalysis, BMP- normal  Imaging: DG chest portable 1 view- no significant findings  Consults: Dr. Sharyn Lull  Therapeutics: None  Assessment: Dizziness  Plan: Patient presents with  complaints of "funny feeling" upon standing for the last week. Patient reports that sometimes when standing she has this feeling. I made numerous attempts at trying to have patient describe the sensation, she just Reporting that she felt "funny". She denied vertigo sensations including room spinning, her spinning, nausea, vomiting. She denies any cardiopulmonary symptoms and clearing chest pain, shortness of breath, nausea vomiting, headache. Patient reports that she had been seen by her cardiologist Dr. Sharyn Lull on Friday for similar complaints. I contacted Dr. Sharyn Lull informed him that she was here today but wasn't able to access his notes and she was vague about the visit and recommendations. He reported that he did see her, with an EKG that was unchanged from her normal morning she had some vague chest complaints. He reported that she should follow-up with her stress test as previously scheduled. Today's evaluation was benign as symptoms were not present, I ambulated the patient personally she complained of no concerning signs or symptoms had no difficulty with ambulation. Labs today were  unremarkable, along with EKG and chest portable. Today's presentation unlikely due to acute neurological dysfunction, more likely related to episodic orthostatic changes. Patient is instructed to monitor for new or worsening signs or symptoms, and follow-up with [facility] for further evaluation and management. She is encouraged to follow up with her stress test and Dr. Sharyn LullHarwani. Patient understood and agreed to today's plan and had no further questions or concerns at time of discharge.      Eyvonne MechanicJeffrey Trell Secrist, PA-C 03/12/15 1742  Samuel JesterKathleen McManus, DO 03/15/15 1112

## 2015-03-12 NOTE — Discharge Instructions (Signed)
Please follow-up with [facility] for further evaluation and management of ongoing symptoms. Please follow-up with Dr. Sharyn LullHarwani for previously scheduled cardiac testing. Please return to the ED if new or worsening signs or symptoms present. Please use medication as directed.

## 2015-03-12 NOTE — ED Notes (Signed)
States son will come pick her up; if he does not come, she will get a cab.

## 2015-03-12 NOTE — ED Notes (Signed)
Sudden onset of palpitations and great fatigue this morning; this follows a similar feeling on Wednesday. Went to cardiologist on Friday. Denies CP. Wears nitro patch. SR with PAC's in route. Sudden onset of nausea on arrival to ED with one vomiting episode.

## 2015-03-20 ENCOUNTER — Encounter (HOSPITAL_COMMUNITY): Admission: RE | Admit: 2015-03-20 | Payer: Medicare Other | Source: Ambulatory Visit

## 2015-03-20 ENCOUNTER — Encounter (HOSPITAL_COMMUNITY): Payer: Medicare Other | Attending: Cardiology

## 2015-03-20 ENCOUNTER — Encounter (HOSPITAL_COMMUNITY): Payer: Medicare Other

## 2015-08-24 ENCOUNTER — Emergency Department (HOSPITAL_COMMUNITY): Payer: Medicare Other

## 2015-08-24 ENCOUNTER — Encounter (HOSPITAL_COMMUNITY): Payer: Self-pay | Admitting: *Deleted

## 2015-08-24 ENCOUNTER — Emergency Department (HOSPITAL_COMMUNITY)
Admission: EM | Admit: 2015-08-24 | Discharge: 2015-08-24 | Disposition: A | Discharge status: Home / Self Care | Payer: Medicare Other | Attending: Emergency Medicine | Admitting: Emergency Medicine

## 2015-08-24 ENCOUNTER — Ambulatory Visit (INDEPENDENT_AMBULATORY_CARE_PROVIDER_SITE_OTHER): Payer: Medicare Other | Admitting: Family Medicine

## 2015-08-24 VITALS — BP 118/70 | HR 86 | Temp 98.5°F | Resp 16 | Ht 61.75 in | Wt 121.8 lb

## 2015-08-24 DIAGNOSIS — Z79899 Other long term (current) drug therapy: Secondary | ICD-10-CM | POA: Insufficient documentation

## 2015-08-24 DIAGNOSIS — G44021 Chronic cluster headache, intractable: Secondary | ICD-10-CM

## 2015-08-24 DIAGNOSIS — R413 Other amnesia: Secondary | ICD-10-CM | POA: Diagnosis not present

## 2015-08-24 DIAGNOSIS — E0590 Thyrotoxicosis, unspecified without thyrotoxic crisis or storm: Secondary | ICD-10-CM

## 2015-08-24 DIAGNOSIS — F22 Delusional disorders: Secondary | ICD-10-CM

## 2015-08-24 DIAGNOSIS — R079 Chest pain, unspecified: Secondary | ICD-10-CM | POA: Diagnosis not present

## 2015-08-24 DIAGNOSIS — I10 Essential (primary) hypertension: Secondary | ICD-10-CM | POA: Insufficient documentation

## 2015-08-24 DIAGNOSIS — R9431 Abnormal electrocardiogram [ECG] [EKG]: Secondary | ICD-10-CM | POA: Insufficient documentation

## 2015-08-24 DIAGNOSIS — R451 Restlessness and agitation: Secondary | ICD-10-CM | POA: Diagnosis not present

## 2015-08-24 DIAGNOSIS — R634 Abnormal weight loss: Secondary | ICD-10-CM | POA: Diagnosis not present

## 2015-08-24 DIAGNOSIS — H6123 Impacted cerumen, bilateral: Secondary | ICD-10-CM

## 2015-08-24 DIAGNOSIS — R42 Dizziness and giddiness: Secondary | ICD-10-CM | POA: Diagnosis not present

## 2015-08-24 DIAGNOSIS — R410 Disorientation, unspecified: Secondary | ICD-10-CM | POA: Diagnosis not present

## 2015-08-24 DIAGNOSIS — M1990 Unspecified osteoarthritis, unspecified site: Secondary | ICD-10-CM | POA: Diagnosis not present

## 2015-08-24 DIAGNOSIS — Z7982 Long term (current) use of aspirin: Secondary | ICD-10-CM | POA: Insufficient documentation

## 2015-08-24 DIAGNOSIS — Z880 Allergy status to penicillin: Secondary | ICD-10-CM | POA: Insufficient documentation

## 2015-08-24 DIAGNOSIS — R4182 Altered mental status, unspecified: Secondary | ICD-10-CM | POA: Diagnosis present

## 2015-08-24 LAB — CBC
HCT: 30.2 % — ABNORMAL LOW (ref 36.0–46.0)
Hemoglobin: 10.1 g/dL — ABNORMAL LOW (ref 12.0–15.0)
MCH: 29.2 pg (ref 26.0–34.0)
MCHC: 33.4 g/dL (ref 30.0–36.0)
MCV: 87.3 fL (ref 78.0–100.0)
PLATELETS: 292 10*3/uL (ref 150–400)
RBC: 3.46 MIL/uL — AB (ref 3.87–5.11)
RDW: 13.5 % (ref 11.5–15.5)
WBC: 5.1 10*3/uL (ref 4.0–10.5)

## 2015-08-24 LAB — COMPLETE METABOLIC PANEL WITH GFR
ALT: 9 U/L (ref 6–29)
AST: 17 U/L (ref 10–35)
Albumin: 3.8 g/dL (ref 3.6–5.1)
Alkaline Phosphatase: 55 U/L (ref 33–130)
BUN: 10 mg/dL (ref 7–25)
CO2: 27 mmol/L (ref 20–31)
Calcium: 9.2 mg/dL (ref 8.6–10.4)
Chloride: 106 mmol/L (ref 98–110)
Creat: 0.6 mg/dL (ref 0.60–0.88)
GFR, Est African American: >89 mL/min (ref >=60)
GFR, Est Non African American: 83 mL/min (ref >=60)
Glucose, Bld: 88 mg/dL (ref 65–99)
Potassium: 4.1 mmol/L (ref 3.5–5.3)
Sodium: 139 mmol/L (ref 135–146)
Total Bilirubin: 0.6 mg/dL (ref 0.2–1.2)
Total Protein: 6.2 g/dL (ref 6.1–8.1)

## 2015-08-24 LAB — BASIC METABOLIC PANEL
Anion gap: 7 (ref 5–15)
BUN: 7 mg/dL (ref 6–20)
CALCIUM: 9.4 mg/dL (ref 8.9–10.3)
CHLORIDE: 106 mmol/L (ref 101–111)
CO2: 27 mmol/L (ref 22–32)
CREATININE: 0.71 mg/dL (ref 0.44–1.00)
Glucose, Bld: 107 mg/dL — ABNORMAL HIGH (ref 65–99)
Potassium: 3.7 mmol/L (ref 3.5–5.1)
SODIUM: 140 mmol/L (ref 135–145)

## 2015-08-24 LAB — POCT SEDIMENTATION RATE: POCT SED RATE: 18 mm/hr (ref 0–22)

## 2015-08-24 LAB — URINALYSIS, ROUTINE W REFLEX MICROSCOPIC
BILIRUBIN URINE: NEGATIVE
GLUCOSE, UA: NEGATIVE mg/dL
HGB URINE DIPSTICK: NEGATIVE
Ketones, ur: NEGATIVE mg/dL
Leukocytes, UA: NEGATIVE
Nitrite: NEGATIVE
PROTEIN: NEGATIVE mg/dL
Specific Gravity, Urine: 1.01 (ref 1.005–1.030)
UROBILINOGEN UA: 1 mg/dL (ref 0.0–1.0)
pH: 6 (ref 5.0–8.0)

## 2015-08-24 LAB — POCT URINALYSIS DIP (MANUAL ENTRY)
Bilirubin, UA: NEGATIVE
Blood, UA: NEGATIVE
Glucose, UA: NEGATIVE
Ketones, POC UA: NEGATIVE
Leukocytes, UA: NEGATIVE
Nitrite, UA: NEGATIVE
Protein Ur, POC: NEGATIVE
Spec Grav, UA: 1.015
Urobilinogen, UA: 0.2
pH, UA: 5.5

## 2015-08-24 LAB — THYROID PANEL WITH TSH
Free Thyroxine Index: 2.5 (ref 1.4–3.8)
T3 Uptake: 33 % (ref 22–35)
T4, Total: 7.5 ug/dL (ref 4.5–12.0)
TSH: <0.008 u[IU]/mL — ABNORMAL LOW (ref 0.350–4.500)

## 2015-08-24 LAB — VITAMIN B12
Vitamin B-12: 410 pg/mL (ref 211–911)
Vitamin B-12: 358 pg/mL (ref 180–914)

## 2015-08-24 LAB — POCT CBC
Granulocyte percent: 60.2 %G (ref 37–80)
HCT, POC: 29.6 % — AB (ref 37.7–47.9)
Hemoglobin: 10 g/dL — AB (ref 12.2–16.2)
Lymph, poc: 1.9 (ref 0.6–3.4)
MCH, POC: 28.7 pg (ref 27–31.2)
MCHC: 33.6 g/dL (ref 31.8–35.4)
MCV: 85.3 fL (ref 80–97)
MID (cbc): 0.2 (ref 0–0.9)
MPV: 6.8 fL (ref 0–99.8)
POC Granulocyte: 3.3 (ref 2–6.9)
POC LYMPH PERCENT: 35.4 %L (ref 10–50)
POC MID %: 4.4 %M (ref 0–12)
Platelet Count, POC: 308 10*3/uL (ref 142–424)
RBC: 3.47 M/uL — AB (ref 4.04–5.48)
RDW, POC: 14.1 %
WBC: 5.5 10*3/uL (ref 4.6–10.2)

## 2015-08-24 LAB — FOLATE: FOLATE: 12.8 ng/mL (ref >5.9)

## 2015-08-24 LAB — POC MICROSCOPIC URINALYSIS (UMFC): Mucus: ABSENT

## 2015-08-24 LAB — I-STAT TROPONIN, ED: TROPONIN I, POC: 0.01 ng/mL (ref 0.00–0.08)

## 2015-08-24 NOTE — ED Notes (Signed)
Pt was irritable and agitated when this EMT entered the room. Pt would not let discharge v/s be taken. Pt family was in a hurry to leave and return home as well.
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
## 2015-08-24 NOTE — ED Notes (Addendum)
Patient went to urgent care today for complaints of dizziness and chest pain. Patient reports she has had dizziness x 3 days and had ears cleaned out this afternoon while at urgent care, patient states she feels better, daughter reports there was large amount of build up per doctors. Patient also reports she had chest pain Monday and Tuesday, patient is very vague in her symptoms. Patient had EKG done while urgent care and physician noted abnormality, patient sent here for further work up.

## 2015-08-24 NOTE — ED Provider Notes (Signed)
CSN: [id number]     Arrival date & time 08/24/15  1414 History   First MD Initiated Contact with Patient 08/24/15 1634     Chief Complaint  Patient presents with  . Chest Pain  . Abnormal ECG     (Consider location/radiation/quality/duration/timing/severity/associated sxs/prior Treatment) Patient is a 81 y.o. female presenting with altered mental status. The history is provided by the patient and a relative. The history is limited by the condition of the patient.  Altered Mental Status Presenting symptoms: confusion, disorientation and memory loss   Severity:  Mild Most recent episode:  Yesterday Episode history:  Continuous Timing:  Intermittent Progression:  Waxing and waning Chronicity:  Recurrent Context: not alcohol use, not head injury, taking medications as prescribed, not a nursing home resident, not a recent change in medication, not a recent illness and not a recent infection   Associated symptoms: agitation   Associated symptoms: no abdominal pain, no depression, no difficulty breathing, no eye deviation, no fever, no hallucinations, no headaches, no light-headedness, no nausea, no palpitations, no rash, no seizures, no slurred speech, no suicidal behavior, no visual change, no vomiting and no weakness     Past Medical History  Diagnosis Date  . Hypertension   . Arthritis    History reviewed. No pertinent past surgical history. History reviewed. No pertinent family history. Social History  Substance Use Topics  . Smoking status: Never Smoker   . Smokeless tobacco: None  . Alcohol Use: No   OB History    No data available     Review of Systems  Constitutional: Negative for fever.  HENT: Negative for facial swelling.   Respiratory: Negative for shortness of breath.   Cardiovascular: Negative for chest pain and palpitations.  Gastrointestinal: Negative for nausea, vomiting and abdominal pain.  Genitourinary: Negative for dysuria.  Musculoskeletal: Negative  for back pain.  Skin: Negative for rash.  Neurological: Negative for dizziness, tremors, seizures, syncope, facial asymmetry, speech difficulty, weakness, light-headedness, numbness and headaches.  Psychiatric/Behavioral: Positive for memory loss, confusion, decreased concentration and agitation. Negative for hallucinations and sleep disturbance.      Allergies  Anesthetics, amide and Penicillins  Home Medications   Prior to Admission medications   Medication Sig Start Date End Date Taking? Authorizing Provider  acetaminophen (TYLENOL) 650 MG CR tablet Take 650 mg by mouth every 8 (eight) hours as needed for pain.   Yes Historical Provider, MD  amLODipine (NORVASC) 5 MG tablet Take 5 mg by mouth daily.    Yes Historical Provider, MD  aspirin EC 81 MG tablet Take 81 mg by mouth daily.    Yes Historical Provider, MD  benazepril (LOTENSIN) 20 MG tablet Take 20 mg by mouth daily.    Yes Historical Provider, MD  methimazole (TAPAZOLE) 10 MG tablet Take 1 tablet (10 mg total) by mouth daily. Patient not taking: Reported on 08/24/2015 12/17/12   Pearline Cables, MD  traMADol (ULTRAM) 50 MG tablet Take 1 tablet (50 mg total) by mouth every 6 (six) hours as needed. Patient not taking: Reported on 08/24/2015 12/31/13   Vanetta Mulders, MD   BP 124/72 mmHg  Pulse 63  Temp(Src) 98 F (36.7 C)  Resp 15  SpO2 100% Physical Exam  Constitutional: She is oriented to person, place, and time. She appears well-developed and well-nourished. No distress.  HENT:  Head: Normocephalic and atraumatic.  Right Ear: External ear normal.  Left Ear: External ear normal.  Nose: Nose normal.  Mouth/Throat: Oropharynx is clear  and moist. No oropharyngeal exudate.  Eyes: Conjunctivae and EOM are normal. Pupils are equal, round, and reactive to light. Right eye exhibits no discharge. Left eye exhibits no discharge. No scleral icterus.  Neck: Normal range of motion. Neck supple. No JVD present. No tracheal  deviation present. No thyromegaly present.  Cardiovascular: Normal rate, regular rhythm and intact distal pulses.   Pulmonary/Chest: Effort normal and breath sounds normal. No stridor. No respiratory distress. She has no wheezes. She has no rales. She exhibits no tenderness.  Abdominal: Soft. She exhibits no distension. There is no tenderness.  Musculoskeletal: Normal range of motion. She exhibits no edema or tenderness.  Lymphadenopathy:    She has no cervical adenopathy.  Neurological: She is alert and oriented to person, place, and time. She has normal strength. She displays atrophy. She displays no tremor. No cranial nerve deficit or sensory deficit. She exhibits abnormal muscle tone. She displays no seizure activity. GCS motor subscore is 4.  Skin: Skin is warm and dry. No rash noted. She is not diaphoretic. No erythema. No pallor.  Psychiatric: Judgment and thought content normal. Her affect is blunt. Her speech is delayed. She is slowed and withdrawn. Cognition and memory are impaired.  Nursing note and vitals reviewed.   ED Course  Procedures (including critical care time) Labs Review Labs Reviewed  BASIC METABOLIC PANEL - Abnormal; Notable for the following:    Glucose, Bld 107 (*)    All other components within normal limits  CBC - Abnormal; Notable for the following:    RBC 3.46 (*)    Hemoglobin 10.1 (*)    HCT 30.2 (*)    All other components within normal limits  URINALYSIS, ROUTINE W REFLEX MICROSCOPIC (NOT AT [facility])  FOLATE  VITAMIN B12  I-STAT TROPOININ, ED    Imaging Review Dg Chest 2 View  08/24/2015  CLINICAL DATA:  Left-sided chest pain and cough for 2 days. EXAM: CHEST  2 VIEW COMPARISON:  Mar 12, 2015. FINDINGS: The heart size and mediastinal contours are within normal limits. Both lungs are clear. No pneumothorax or pleural effusion is noted. Mild levoscoliosis of thoracolumbar spine is noted, with multilevel degenerative disc disease. Narrowing of right  subacromial space is noted consistent with rotator cuff injury. IMPRESSION: No acute cardiopulmonary abnormality seen. Electronically Signed   By: Lupita RaiderJames  Green Jr, M.D.   On: 08/24/2015 16:10   Ct Head Wo Contrast  08/24/2015  CLINICAL DATA:  81 year old female with dizziness for the past 3 days. EXAM: CT HEAD WITHOUT CONTRAST TECHNIQUE: Contiguous axial images were obtained from the base of the skull through the vertex without intravenous contrast. COMPARISON:  Head CT 12/07/2012.  Brain MRI 12/31/2013. FINDINGS: Slight prominence of the pituitary gland which measures up to 11 mm in diameter is unchanged compared to prior head CT 12/07/2012. No acute intracranial abnormalities. Specifically, no evidence of acute intracranial hemorrhage, no definite findings of acute/subacute cerebral ischemia, no mass effect, hydrocephalus or abnormal intra or extra-axial fluid collections. Visualized paranasal sinuses and mastoids are well pneumatized. No acute displaced skull fractures are identified. IMPRESSION: 1. No acute intracranial abnormalities. 2. Pituitary gland remains mildly enlarged. Consider dedicated nonemergent pituitary protocol MRI if clinically appropriate. Electronically Signed   By: Trudie Reedaniel  Entrikin M.D.   On: 08/24/2015 18:50   I have personally reviewed and evaluated these images and lab results as part of my medical decision-making.  ECG:  Baseline artifact.  Likely sinus arrhythmia.  No significant change from prior tracings.  MDM   Final diagnoses:  Memory changes    Per family at bedside patient has had worsening cognitive ability for the past 2 months. Now over the past few days patient appears to be significantly worse. Patient's niece who takes care of her frequently states that she has been "making up stories" and is sometimes elaborate her description and answers did not take place. Patient does not appear to be hallucinating. Possible confabulation.. No history of EtOH abuse.  No  significant findings on review of systems. No recent signs of infection. Patient otherwise in no acute distress at this time. Differential includes CVA, vascular dementia, Alzheimer's dementia, infectious, or metabolic process, nutritional deficiency. No recent falls.  Lab w/u and imaging show no acute findings.  Suspect indolent cause such as Alzheimer's dementia, or vascular dementia.  Pt does not appear to have acute delirium.  Patient and family were given return precautions for confusion and mental status changes.  Family was advised to return with patient for actely worsening symptoms, inability to take medications, or other acute concerns.  Advised to follow up with neurology in 1-2 weeks for futher evaluation.  Family in agreement with and expressed understanding of follow plan, plan of care, and return precautions.  All questions answered prior to discharge.  Patient was discharged in stable condition with family, ambulating without difficulty.  Patient care was discussed with my attending, Dr. Fayrene Fearing.   Gavin Pound, MD 08/25/15 1610  Rolland Porter, MD 09/13/15 [phone]

## 2015-08-28 ENCOUNTER — Emergency Department (HOSPITAL_COMMUNITY)
Admission: EM | Admit: 2015-08-28 | Discharge: 2015-08-28 | Disposition: A | Discharge status: Home / Self Care | Payer: Medicare Other | Attending: Emergency Medicine | Admitting: Emergency Medicine

## 2015-08-28 ENCOUNTER — Encounter (HOSPITAL_COMMUNITY): Payer: Self-pay | Admitting: Emergency Medicine

## 2015-08-28 DIAGNOSIS — R195 Other fecal abnormalities: Secondary | ICD-10-CM | POA: Diagnosis not present

## 2015-08-28 DIAGNOSIS — R42 Dizziness and giddiness: Secondary | ICD-10-CM | POA: Insufficient documentation

## 2015-08-28 DIAGNOSIS — H538 Other visual disturbances: Secondary | ICD-10-CM

## 2015-08-28 DIAGNOSIS — D649 Anemia, unspecified: Secondary | ICD-10-CM | POA: Diagnosis not present

## 2015-08-28 DIAGNOSIS — I10 Essential (primary) hypertension: Secondary | ICD-10-CM | POA: Diagnosis not present

## 2015-08-28 DIAGNOSIS — M1990 Unspecified osteoarthritis, unspecified site: Secondary | ICD-10-CM | POA: Insufficient documentation

## 2015-08-28 DIAGNOSIS — Z7982 Long term (current) use of aspirin: Secondary | ICD-10-CM | POA: Insufficient documentation

## 2015-08-28 DIAGNOSIS — Z79899 Other long term (current) drug therapy: Secondary | ICD-10-CM | POA: Diagnosis not present

## 2015-08-28 DIAGNOSIS — Z880 Allergy status to penicillin: Secondary | ICD-10-CM | POA: Insufficient documentation

## 2015-08-28 LAB — BASIC METABOLIC PANEL
ANION GAP: 4 — AB (ref 5–15)
BUN: 12 mg/dL (ref 6–20)
CALCIUM: 9.4 mg/dL (ref 8.9–10.3)
CO2: 30 mmol/L (ref 22–32)
CREATININE: 0.81 mg/dL (ref 0.44–1.00)
Chloride: 108 mmol/L (ref 101–111)
GFR calc Af Amer: >60 mL/min (ref >60)
GLUCOSE: 105 mg/dL — AB (ref 65–99)
Potassium: 3.9 mmol/L (ref 3.5–5.1)
Sodium: 142 mmol/L (ref 135–145)

## 2015-08-28 LAB — URINALYSIS, ROUTINE W REFLEX MICROSCOPIC
BILIRUBIN URINE: NEGATIVE
GLUCOSE, UA: NEGATIVE mg/dL
HGB URINE DIPSTICK: NEGATIVE
Ketones, ur: NEGATIVE mg/dL
Leukocytes, UA: NEGATIVE
Nitrite: NEGATIVE
PROTEIN: NEGATIVE mg/dL
Specific Gravity, Urine: 1.019 (ref 1.005–1.030)
UROBILINOGEN UA: 0.2 mg/dL (ref 0.0–1.0)
pH: 6 (ref 5.0–8.0)

## 2015-08-28 LAB — CBC
HCT: 29.4 % — ABNORMAL LOW (ref 36.0–46.0)
Hemoglobin: 9.6 g/dL — ABNORMAL LOW (ref 12.0–15.0)
MCH: 29.3 pg (ref 26.0–34.0)
MCHC: 32.7 g/dL (ref 30.0–36.0)
MCV: 89.6 fL (ref 78.0–100.0)
PLATELETS: 402 10*3/uL — AB (ref 150–400)
RBC: 3.28 MIL/uL — ABNORMAL LOW (ref 3.87–5.11)
RDW: 14.1 % (ref 11.5–15.5)
WBC: 5.6 10*3/uL (ref 4.0–10.5)

## 2015-08-28 LAB — POC OCCULT BLOOD, ED: FECAL OCCULT BLD: POSITIVE — AB

## 2015-08-28 NOTE — ED Notes (Signed)
Per EMS, [facility] c/o blurred vision and dizziness that she has [facility]. Pt sts this has been going on for a week. Daughter states this has been going on much longer and that they are concerned abut dementia. Pt refuses to go see PCP to be evaluated for dementia.  A&O x4. Pt has started to wander from home during the day. Family would like someone from social services to talk to her because they don't want to have to IVC her. Per family, Pt was dx with arrhythmia 4 years ago and that is the reason for her symptoms.

## 2015-08-28 NOTE — ED Provider Notes (Signed)
CSN: [id number]     Arrival date & time 08/28/15  1652 History   First MD Initiated Contact with Patient 08/28/15 1743     Chief Complaint  Patient presents with  . Dizziness  . Blurred Vision     (Consider location/radiation/quality/duration/timing/severity/associated sxs/prior Treatment) HPI   Leah Glass is a 81 y.o. female, pt with history of HTN, presenting to the ED with an episode of dizziness and blurred vision as soon as she woke up at 8:30am this morning, it lasted for about 30 min, and then completely resolved. Pt currently has no complaints. Pt daughter, [name], is at the bedside and states pt has this complaint a few times every year for the last few years, she calls EMS, is taken to the hospital, and no abnormalities are found. The latest evaluation was this past Thursday. [name] also states that pt has been showing signs of confusion and forgetfulness over the last few years, which they suspect is dementia, but pt refuses to go to a PCP for a diagnosis. [name] states pt is acting normally and has been acting normally all day.    Past Medical History  Diagnosis Date  . Hypertension   . Arthritis    History reviewed. No pertinent past surgical history. No family history on file. Social History  Substance Use Topics  . Smoking status: Never Smoker   . Smokeless tobacco: None  . Alcohol Use: No   OB History    No data available     Review of Systems  Eyes: Positive for visual disturbance.  Neurological: Positive for dizziness.  All other systems reviewed and are negative.     Allergies  Anesthetics, amide and Penicillins  Home Medications   Prior to Admission medications   Medication Sig Start Date End Date Taking? Authorizing Provider  amLODipine (NORVASC) 5 MG tablet Take 5 mg by mouth daily.    Yes Historical Provider, MD  aspirin EC 81 MG tablet Take 81 mg by mouth daily.    Yes Historical Provider, MD  benazepril (LOTENSIN) 20 MG tablet Take 20  mg by mouth daily.    Yes Historical Provider, MD  acetaminophen (TYLENOL) 650 MG CR tablet Take 650 mg by mouth every 8 (eight) hours as needed for pain.    Historical Provider, MD  methimazole (TAPAZOLE) 10 MG tablet Take 1 tablet (10 mg total) by mouth daily. Patient not taking: Reported on 08/24/2015 12/17/12   Pearline Cables, MD  traMADol (ULTRAM) 50 MG tablet Take 1 tablet (50 mg total) by mouth every 6 (six) hours as needed. Patient not taking: Reported on 08/24/2015 12/31/13   Vanetta Mulders, MD   BP 134/74 mmHg  Pulse 73  Temp(Src) 98.1 F (36.7 C) (Oral)  Resp 15  Ht  (1.575 m)  Wt 120 lb (54.432 kg)  BMI 21.94 kg/m2  SpO2 100% Physical Exam  Constitutional: She appears well-developed and well-nourished. No distress.  HENT:  Head: Normocephalic and atraumatic.  Right Ear: Hearing, tympanic membrane, external ear and ear canal normal.  Left Ear: Hearing, tympanic membrane, external ear and ear canal normal.  Mouth/Throat: Oropharynx is clear and moist.  Eyes: Conjunctivae are normal. Pupils are equal, round, and reactive to light.  Cardiovascular: Normal rate, regular rhythm and normal heart sounds.   Pulmonary/Chest: Effort normal and breath sounds normal. No accessory muscle usage. No tachypnea. No respiratory distress.  Abdominal: Soft. Bowel sounds are normal.  Genitourinary: Rectal exam shows no external hemorrhoid, no internal  hemorrhoid and anal tone normal. Guaiac positive stool.  Hemocult positive. RN present during rectal exam. No abnormalities found.  Musculoskeletal: She exhibits no edema or tenderness.  Neurological: She is alert.  No sensory deficits. Strength 5/5 in all extremities. No gait disturbance. Cranial nerves II-XII grossly intact.   Skin: Skin is warm and dry. She is not diaphoretic.  Nursing note and vitals reviewed.   ED Course  Procedures (including critical care time) Labs Review Labs Reviewed  BASIC METABOLIC PANEL - Abnormal;  Notable for the following:    Glucose, Bld 105 (*)    Anion gap 4 (*)    All other components within normal limits  CBC - Abnormal; Notable for the following:    RBC 3.28 (*)    Hemoglobin 9.6 (*)    HCT 29.4 (*)    Platelets 402 (*)    All other components within normal limits  POC OCCULT BLOOD, ED - Abnormal; Notable for the following:    Fecal Occult Bld POSITIVE (*)    All other components within normal limits  URINALYSIS, ROUTINE W REFLEX MICROSCOPIC (NOT AT [facility])    Imaging Review No results found. I have personally reviewed and evaluated these images and lab results as part of my medical decision-making.   EKG Interpretation None      MDM   Final diagnoses:  Dizziness  Blurred vision  Anemia, unspecified anemia type    Leah Glass presents with a single episode of dizziness and blurred vision for about 30 minutes upon wakening this morning with complete resolution since then. Findings and plan of care discussed with Bethann BerkshireJoseph Zammit, MD. Family concerned about dementia in this pt. Pt apparently refuses to go to PCP. Latest ED visit was 10/27 where a head CT was done along with labs and EKG with no acute abnormalities found. Will obtain basic labs and then discharge pt with advice to follow up with PCP. Pt daughter, Buck MamFunda, at bedside, as well as the pt agrees to plan of care. CBC shows Hgb of 9.6, down from 10 on 10/27. Will check hemocult.  7:25 PM Pt reassessed. Pt still has no complaints. Hemocult is positive. Will have pt follow up with GI outpatient or PCP.  Anselm PancoastShawn C Joy, PA-C 08/28/15 1940  Bethann BerkshireJoseph Zammit, MD 08/28/15 [phone]

## 2015-08-28 NOTE — ED Notes (Signed)
Pt comes to ED for temporary blurry vision for 30 min's this morning upon waking up at 8:00am.The blurred vision was accompanied with temporary dizziness.

## 2015-08-28 NOTE — Discharge Instructions (Signed)
You have been seen today for an episode of dizziness and blurred vision that resolved. Your hemoglobin is a little lower than the last time it was tested. You also have microscopic blood in your GI tract. It is recommended you follow up with PCP on all these matters. Return to ED should symptoms worsen.

## 2015-08-28 NOTE — ED Notes (Signed)
Bed: [id number] Expected date:  Expected time:  Means of arrival:  Comments: EMS- 81yo F, blurred vision/Hx of same/dementia?

## 2015-11-09 ENCOUNTER — Emergency Department (HOSPITAL_COMMUNITY): Payer: Medicare Other

## 2015-11-09 ENCOUNTER — Emergency Department (HOSPITAL_COMMUNITY)
Admission: EM | Admit: 2015-11-09 | Discharge: 2015-11-10 | Disposition: A | Discharge status: Home / Self Care | Payer: Medicare Other | Attending: Physician Assistant | Admitting: Physician Assistant

## 2015-11-09 ENCOUNTER — Encounter (HOSPITAL_COMMUNITY): Payer: Self-pay

## 2015-11-09 DIAGNOSIS — M1990 Unspecified osteoarthritis, unspecified site: Secondary | ICD-10-CM | POA: Insufficient documentation

## 2015-11-09 DIAGNOSIS — Z880 Allergy status to penicillin: Secondary | ICD-10-CM | POA: Insufficient documentation

## 2015-11-09 DIAGNOSIS — I10 Essential (primary) hypertension: Secondary | ICD-10-CM | POA: Insufficient documentation

## 2015-11-09 DIAGNOSIS — M25511 Pain in right shoulder: Secondary | ICD-10-CM | POA: Insufficient documentation

## 2015-11-09 DIAGNOSIS — Z7982 Long term (current) use of aspirin: Secondary | ICD-10-CM | POA: Insufficient documentation

## 2015-11-09 DIAGNOSIS — Z79899 Other long term (current) drug therapy: Secondary | ICD-10-CM | POA: Insufficient documentation

## 2015-11-09 MED ORDER — ACETAMINOPHEN 325 MG PO TABS
650.0000 mg | ORAL_TABLET | Freq: Once | ORAL | Status: AC
  Administered 2015-11-09: 650 mg via ORAL
  Filled 2015-11-09: qty 2

## 2015-11-09 NOTE — ED Notes (Signed)
[facility]. Pt complains of right shoulder pain. Pt states she fell back at at the first of the year but has not had any trauma other than that. Per EMS pt waked to the truck. VS stable.

## 2015-11-09 NOTE — ED Provider Notes (Signed)
CSN: [id number]     Arrival date & time 11/09/15  2203 History   First MD Initiated Contact with Patient 11/09/15 2237     Chief Complaint  Patient presents with  . Shoulder Pain    right     (Consider location/radiation/quality/duration/timing/severity/associated sxs/prior Treatment) HPI   Patient 82 year old female with past medical history of hypertension who presents to the ED via EMS with complaint of right shoulder pain, onset this morning. Patient states when she bent down and reached to pick up her niece fever she began having constant aching pain to her right shoulder. Denies any aggravating or alleviating factors. Denies fever, chills, numbness, tingling, weakness, swelling. Denies any other recent all, trauma, injury. She notes she took Bayer aspirin at home with mild relief.  Past Medical History  Diagnosis Date  . Hypertension   . Arthritis    History reviewed. No pertinent past surgical history. History reviewed. No pertinent family history. Social History  Substance Use Topics  . Smoking status: Never Smoker   . Smokeless tobacco: None  . Alcohol Use: No   OB History    No data available     Review of Systems  Musculoskeletal: Positive for arthralgias (right shoulder).  All other systems reviewed and are negative.     Allergies  Anesthetics, amide and Penicillins  Home Medications   Prior to Admission medications   Medication Sig Start Date End Date Taking? Authorizing Provider  acetaminophen (TYLENOL) 650 MG CR tablet Take 650 mg by mouth every 8 (eight) hours as needed for pain. Reported on 11/09/2015   Yes Historical Provider, MD  amLODipine (NORVASC) 5 MG tablet Take 5 mg by mouth daily.    Yes Historical Provider, MD  aspirin EC 81 MG tablet Take 81 mg by mouth daily.    Yes Historical Provider, MD  benazepril (LOTENSIN) 20 MG tablet Take 20 mg by mouth daily.    Yes Historical Provider, MD  methimazole (TAPAZOLE) 10 MG tablet Take 1 tablet (10 mg  total) by mouth daily. Patient not taking: Reported on 08/24/2015 12/17/12   Pearline CablesJessica C Copland, MD  traMADol (ULTRAM) 50 MG tablet Take 1 tablet (50 mg total) by mouth every 6 (six) hours as needed. Patient not taking: Reported on 08/24/2015 12/31/13   Vanetta MuldersScott Zackowski, MD   BP 128/92 mmHg  Pulse 73  Temp(Src) 98.2 F (36.8 C) (Oral)  Resp 16  SpO2 100% Physical Exam  Constitutional: She is oriented to person, place, and time. She appears well-developed and well-nourished.  HENT:  Head: Normocephalic and atraumatic.  Eyes: Conjunctivae and EOM are normal. Right eye exhibits no discharge. Left eye exhibits no discharge. No scleral icterus.  Neck: Normal range of motion. Neck supple.  Cardiovascular: Normal rate, regular rhythm, normal heart sounds and intact distal pulses.   Pulmonary/Chest: Effort normal and breath sounds normal. No respiratory distress.  Abdominal: Soft. Bowel sounds are normal. She exhibits no distension. There is no tenderness.  Musculoskeletal: Normal range of motion. She exhibits tenderness. She exhibits no edema.       Right shoulder: She exhibits tenderness (posterior shoulder). She exhibits normal range of motion, no swelling, no effusion, no crepitus, no deformity, no laceration, no spasm, normal pulse and normal strength.  FROM of BUE, 5/5 strength. Sensation intact. 2+ radial pulses. Cap refill <2.  No C/T/L midline tenderness. FROM of neck and back.   Neurological: She is alert and oriented to person, place, and time.  Skin: Skin is warm and  dry.  Nursing note and vitals reviewed.   ED Course  Procedures (including critical care time) Labs Review Labs Reviewed - No data to display  Imaging Review Dg Shoulder Right  11/10/2015  CLINICAL DATA:  Right shoulder pain.  No known injury. EXAM: RIGHT SHOULDER - 2+ VIEW COMPARISON:  Radiographs 08/07/2012 FINDINGS: No acute fracture. Progressive osteoarthritis with superior subluxation of the humeral head abutting  the undersurface to the acromion. There is glenohumeral and acromioclavicular degenerative change. No bony destruction. IMPRESSION: Progressive osteoarthritis of the right shoulder. Findings consistent with rotator cuff pathology with superior subluxation of the humeral head abutting the undersurface to the acromion. No acute bony abnormality. Electronically Signed   By: Rubye Oaks M.D.   On: 11/10/2015 00:51   I have personally reviewed and evaluated these images and lab results as part of my medical decision-making.  Filed Vitals:   11/09/15 2230 11/09/15 2300  BP: 118/78 128/92  Pulse: 74 73  Temp:    Resp:  16     MDM   Final diagnoses:  Right shoulder pain    Patient presents with right shoulder pain that occurred while bending over to reach to pick up a newspaper. Denies any other fall, trauma, injury. VSS. Exam revealed mild tenderness to posterior lateral right shoulder, right upper extremity otherwise neurovascularly intact. Reports mild improvement of pain with tylenol in the ED. Right shoulder xray showed progressive osteoarthritis and rotator cuff pathology with superior subluxation of humeral head abutting undersurface to acromion. Plan to d/c pt home with ortho follow up.   Evaluation does not show pathology requring ongoing emergent intervention or admission. Pt is hemodynamically stable and mentating appropriately. Discussed findings/results and plan with patient/guardian, who agrees with plan. All questions answered. Return precautions discussed and outpatient follow up given.      Satira Sark [place], [place] 11/10/15 0104  Courteney Randall An, MD 11/14/15 [phone]

## 2015-11-10 NOTE — ED Notes (Signed)
Called xray to get update on results, stated they dont know what the hold up is

## 2015-11-10 NOTE — Discharge Instructions (Signed)
Continue taking Tylenol as prescribed over the counter as needed for pain relief. I also recommend applying ice for 15-20 min 3-4 times daily as needed for pain relief. Please follow up with a primary care provider from the Resource Guide provided below in 3-4 days. I also recommend calling the orthopedic office listed above to schedule a follow up appointment. Please return to the Emergency Department if symptoms worsen or new onset of fever, numbness, tingling, weakness, swelling.

## 2015-11-17 ENCOUNTER — Encounter: Payer: Self-pay | Admitting: Family Medicine

## 2015-11-22 ENCOUNTER — Encounter: Payer: Self-pay | Admitting: Family Medicine

## 2016-06-19 ENCOUNTER — Encounter (HOSPITAL_COMMUNITY): Payer: Self-pay | Admitting: Emergency Medicine

## 2016-06-19 ENCOUNTER — Emergency Department (HOSPITAL_COMMUNITY)
Admission: EM | Admit: 2016-06-19 | Discharge: 2016-06-21 | Payer: Medicare Other | Attending: Emergency Medicine | Admitting: Emergency Medicine

## 2016-06-19 ENCOUNTER — Emergency Department (HOSPITAL_COMMUNITY): Payer: Medicare Other

## 2016-06-19 DIAGNOSIS — Z79899 Other long term (current) drug therapy: Secondary | ICD-10-CM | POA: Diagnosis not present

## 2016-06-19 DIAGNOSIS — Z7982 Long term (current) use of aspirin: Secondary | ICD-10-CM | POA: Diagnosis not present

## 2016-06-19 DIAGNOSIS — F03918 Unspecified dementia, unspecified severity, with other behavioral disturbance: Secondary | ICD-10-CM | POA: Diagnosis present

## 2016-06-19 DIAGNOSIS — F0391 Unspecified dementia with behavioral disturbance: Secondary | ICD-10-CM | POA: Diagnosis not present

## 2016-06-19 DIAGNOSIS — Z049 Encounter for examination and observation for unspecified reason: Secondary | ICD-10-CM

## 2016-06-19 DIAGNOSIS — I10 Essential (primary) hypertension: Secondary | ICD-10-CM | POA: Insufficient documentation

## 2016-06-19 DIAGNOSIS — Z046 Encounter for general psychiatric examination, requested by authority: Secondary | ICD-10-CM | POA: Diagnosis present

## 2016-06-19 HISTORY — DX: Unspecified dementia, unspecified severity, without behavioral disturbance, psychotic disturbance, mood disturbance, and anxiety: F03.90

## 2016-06-19 LAB — COMPREHENSIVE METABOLIC PANEL
ALT: 8 U/L — AB (ref 14–54)
AST: 18 U/L (ref 15–41)
Albumin: 3.8 g/dL (ref 3.5–5.0)
Alkaline Phosphatase: 48 U/L (ref 38–126)
Anion gap: 3 — ABNORMAL LOW (ref 5–15)
BUN: 14 mg/dL (ref 6–20)
CHLORIDE: 112 mmol/L — AB (ref 101–111)
CO2: 28 mmol/L (ref 22–32)
CREATININE: 0.66 mg/dL (ref 0.44–1.00)
Calcium: 9.2 mg/dL (ref 8.9–10.3)
GFR calc Af Amer: >60 mL/min (ref >60)
GFR calc non Af Amer: >60 mL/min (ref >60)
Glucose, Bld: 92 mg/dL (ref 65–99)
POTASSIUM: 3.9 mmol/L (ref 3.5–5.1)
SODIUM: 143 mmol/L (ref 135–145)
Total Bilirubin: 1.5 mg/dL — ABNORMAL HIGH (ref 0.3–1.2)
Total Protein: 6.5 g/dL (ref 6.5–8.1)

## 2016-06-19 LAB — URINALYSIS, ROUTINE W REFLEX MICROSCOPIC
Bilirubin Urine: NEGATIVE
GLUCOSE, UA: NEGATIVE mg/dL
HGB URINE DIPSTICK: NEGATIVE
KETONES UR: NEGATIVE mg/dL
Leukocytes, UA: NEGATIVE
Nitrite: NEGATIVE
PROTEIN: NEGATIVE mg/dL
Specific Gravity, Urine: 1.018 (ref 1.005–1.030)
pH: 8 (ref 5.0–8.0)

## 2016-06-19 LAB — CBC WITH DIFFERENTIAL/PLATELET
BASOS PCT: 0 %
Basophils Absolute: 0 10*3/uL (ref 0.0–0.1)
EOS ABS: 0.3 10*3/uL (ref 0.0–0.7)
EOS PCT: 4 %
HCT: 36.5 % (ref 36.0–46.0)
Hemoglobin: 11.8 g/dL — ABNORMAL LOW (ref 12.0–15.0)
LYMPHS ABS: 1.8 10*3/uL (ref 0.7–4.0)
Lymphocytes Relative: 24 %
MCH: 28 pg (ref 26.0–34.0)
MCHC: 32.3 g/dL (ref 30.0–36.0)
MCV: 86.7 fL (ref 78.0–100.0)
MONOS PCT: 10 %
Monocytes Absolute: 0.7 10*3/uL (ref 0.1–1.0)
NEUTROS PCT: 62 %
Neutro Abs: 4.6 10*3/uL (ref 1.7–7.7)
PLATELETS: 309 10*3/uL (ref 150–400)
RBC: 4.21 MIL/uL (ref 3.87–5.11)
RDW: 15.6 % — ABNORMAL HIGH (ref 11.5–15.5)
WBC: 7.4 10*3/uL (ref 4.0–10.5)

## 2016-06-19 LAB — ETHANOL

## 2016-06-19 MED ORDER — BENAZEPRIL HCL 20 MG PO TABS
20.0000 mg | ORAL_TABLET | Freq: Every day | ORAL | Status: DC
  Administered 2016-06-20 – 2016-06-21 (×2): 20 mg via ORAL
  Filled 2016-06-19 (×2): qty 1

## 2016-06-19 MED ORDER — LORAZEPAM 1 MG PO TABS
1.0000 mg | ORAL_TABLET | Freq: Four times a day (QID) | ORAL | Status: DC | PRN
  Filled 2016-06-19: qty 1

## 2016-06-19 MED ORDER — ASPIRIN EC 81 MG PO TBEC
81.0000 mg | DELAYED_RELEASE_TABLET | Freq: Every day | ORAL | Status: DC
  Administered 2016-06-19 – 2016-06-21 (×3): 81 mg via ORAL
  Filled 2016-06-19 (×3): qty 1

## 2016-06-19 MED ORDER — AMLODIPINE BESYLATE 5 MG PO TABS
5.0000 mg | ORAL_TABLET | Freq: Every day | ORAL | Status: DC
  Administered 2016-06-20 – 2016-06-21 (×2): 5 mg via ORAL
  Filled 2016-06-19 (×2): qty 1

## 2016-06-19 MED ORDER — LORAZEPAM 2 MG/ML IJ SOLN
1.0000 mg | Freq: Once | INTRAMUSCULAR | Status: AC
  Administered 2016-06-19: 1 mg via INTRAMUSCULAR

## 2016-06-19 MED ORDER — HALOPERIDOL LACTATE 5 MG/ML IJ SOLN
5.0000 mg | Freq: Once | INTRAMUSCULAR | Status: AC
  Administered 2016-06-19: 5 mg via INTRAMUSCULAR
  Filled 2016-06-19: qty 1

## 2016-06-19 MED ORDER — LORAZEPAM 2 MG/ML IJ SOLN
INTRAMUSCULAR | Status: AC
  Administered 2016-06-19: 1 mg via INTRAMUSCULAR
  Filled 2016-06-19: qty 1

## 2016-06-19 NOTE — ED Notes (Signed)
Called to assist with patient. Patient at door of room trying to leave, daughter in front of patient. Patient is aggressive and yelling that "I have to get downstairs and get my medicine". Unable to orient patient to ED environment or her room. Sitter also with patient. 3 staff had to assist patient back to bed. Patient refusing po medications, injections of Haldol and Ativan given. Patient in bed with sitter at bedside for patient safety. Patient is confused to time and place and will not reorient. Patient is aggressive in speech and posture to staff and daughter.

## 2016-06-19 NOTE — ED Triage Notes (Addendum)
Per EMS, patient's dementia has been progressing. Patient has not been eating/sleeping and neglecting her personal hygiene. Family also reports patient has been hallucinating and has becoming increasingly hostile with family members. Family reports she is a danger to herself. Patient IVCed. Patient is coming from home and lives with her son.  Per niece, patient has been doing "unsafe things," such as "packing up to leave her house at 4am and falling on the front porch steps."  Upon assessment, patient is calm and cooperative. Patient denies SI/HI/auditory or visual hallucinations. Patient reports she has been eating, sleeping, and taking care of herself as she always has.

## 2016-06-19 NOTE — BH Assessment (Addendum)
Assessment completed. Consulted with Donell SievertSpencer Simon, PA-C who states that patient  will need to see psychiatry in the morning to uphold or rescind the IVC.   Davina PokeJoVea Teairra Millar, LCSW Therapeutic Triage Specialist [facility] 06/19/2016 8:29 PM
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
## 2016-06-19 NOTE — ED Notes (Signed)
Pt walking out of the room. States "I'm going home." Pt redirected to room.
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
## 2016-06-19 NOTE — BH Assessment (Addendum)
Assessment Note  Leah Glass is an 82 y.o. female presenting to [facility]-ED under IVC by her Niece Melvern Bankerlexis Chalmers [phone].  IVC states:  Respondent is an elderly person who is not eating and sleeping. She has been neglecting her personal hygiene. The respondent have been seeing and talking to people that are not there. The respondent is increasingly hostile and confrontational with family members and refuses to see a doctor. In the respondent's present condition she is a danger to herself.   Patient was assessed alone in her room. Patient was able to get from the bed to the floor and started folding the covers while answering questions and stated that she was "getting ready to go home." Patient was oriented to person and was able to state her name and date of birth but was not oriented to place, time, or situation. Patient denies SI and states that she has never attempted to harm herself. Patient denies self injurious behaviors. Patient states "I got grands and great grands, why would I hurt myself?" Patient denies HI and access to firearms. Patient denies pending charges and upcoming court dates. Patient denies AVH and does not appear to be responding to internal stimuli during the assessment although she does appear confused. Patient was pleasant and cooperative but insisted on completing the assessment so that she could "go to my room, I don't know why you came by this time of night and it's time to go to bed."   Patients niece was contacted to obtain collateral information. Patients niece, Melvern Bankerlexis Chalmers, states that the patient has displayed this behavior for the past two years and the symptoms have increased over the past few weeks. Patients niece states that the patient packed her belongings on Tuesday and left the house at 4AM. She states that the patient becomes "more agitated around 6pm." Patients niece states that the patient has never endorsed SI or HI but states that patient has had  hallucinations. When asked for an example the patients daughter states that when they went into a bank the patient identified a person and states that she knew the person and saw her at the bank the previous week. Patients niece states "that's not true because I only take her to the bank once a month so she couldn't have seen the person last week." She states that the patient has also made comments about seeing people in the past such as "where did he go? I thought he was in the living room." Patients niece states that she would like to have a formal diagnosis of Dementia or Alzheimer's so that the family can provide in-home care.   Consulted with Donell SievertSpencer Simon, PA-C who recommends patient see psychiatry in the morning to uphold or rescind IVC.   Diagnosis: Adjustment disorder, With disturbance of conduct  Past Medical History:  Past Medical History:  Diagnosis Date  . Arthritis   . Dementia   . Hypertension     History reviewed. No pertinent surgical history.  Family History: No family history on file.  Social History:  reports that she has never smoked. She has never used smokeless tobacco. She reports that she does not drink alcohol or use drugs.  Additional Social History:  Alcohol / Drug Use Pain Medications: Denies Prescriptions: Denies Over the Counter: Denies History of alcohol / drug use?: No history of alcohol / drug abuse  CIWA: CIWA-Ar BP: 167/62 Pulse Rate: 87 COWS:    Allergies:   Home Medications:  (Not in a hospital  admission)  OB/GYN Status:  No LMP recorded. Patient is postmenopausal.  General Assessment Data Location of Assessment: [facility] TTS Assessment: In system Is this a Tele or Face-to-Face Assessment?: Face-to-Face Is this an Initial Assessment or a Re-assessment for this encounter?: Initial Assessment Marital status: Widowed Is patient pregnant?: No Pregnancy Status: No Living Arrangements: Other relatives Can pt return to current living  arrangement?: Yes Admission Status: Involuntary Is patient capable of signing voluntary admission?: No Referral Source: Other (IVC) Insurance type: [facility] Medicare     Crisis Care Plan Living Arrangements: Other relatives  Education Status Is patient currently in school?: No  Risk to self with the past 6 months Suicidal Ideation: No Has patient been a risk to self within the past 6 months prior to admission? : No Suicidal Intent: No Has patient had any suicidal intent within the past 6 months prior to admission? : No Is patient at risk for suicide?: No Suicidal Plan?: No Has patient had any suicidal plan within the past 6 months prior to admission? : No Access to Means: No What has been your use of drugs/alcohol within the last 12 months?: Denies Previous Attempts/Gestures: No How many times?: 0 Other Self Harm Risks: Denies Triggers for Past Attempts: None known Intentional Self Injurious Behavior: None Family Suicide History: Unknown Recent stressful life event(s): Other (Comment) (Patient denies) Persecutory voices/beliefs?: No Depression: No (denies) Substance abuse history and/or treatment for substance abuse?: No Suicide prevention information given to non-admitted patients: Not applicable  Risk to Others within the past 6 months Homicidal Ideation: No Does patient have any lifetime risk of violence toward others beyond the six months prior to admission? : No Thoughts of Harm to Others: No Current Homicidal Intent: No Current Homicidal Plan: No Access to Homicidal Means: No Identified Victim: Denies History of harm to others?: No Assessment of Violence: None Noted Violent Behavior Description: Denies Does patient have access to weapons?: No Criminal Charges Pending?: No Does patient have a court date: No Is patient on probation?: No  Psychosis Hallucinations: None noted Delusions: None noted  Mental Status Report Appearance/Hygiene: In scrubs Eye Contact:  Fair Motor Activity: Unsteady Level of Consciousness: Alert Mood: Pleasant Affect: Appropriate to circumstance Anxiety Level: None Thought Processes: Relevant Judgement: Impaired Orientation: Person Obsessive Compulsive Thoughts/Behaviors: None  Cognitive Functioning Concentration: Unable to Assess Memory: Unable to Assess Insight: see judgement above Impulse Control: Unable to Assess Appetite:  (UTA) Sleep: Unable to Assess Total Hours of Sleep:  (UTA) Vegetative Symptoms: Unable to Assess     Prior Inpatient Therapy Prior Inpatient Therapy:  (UTA) Prior Therapy Dates: UTA Prior Therapy Facilty/Provider(s): UTA Reason for Treatment: UTA  Prior Outpatient Therapy Prior Outpatient Therapy:  (UTA) Prior Therapy Dates: UTA Prior Therapy Facilty/Provider(s): UTA Reason for Treatment: UTA Does patient have an ACCT team?: Unknown Does patient have Intensive In-House Services?  : Unknown Does patient have [facility] services? : Unknown Does patient have P4CC services?: Unknown          Abuse/Neglect Assessment (Assessment to be complete while patient is alone) Physical Abuse:  (UTA) Verbal Abuse:  (UTA) Sexual Abuse:  (UTA) Exploitation of patient/patient's resources:  (UTA) Self-Neglect:  (UTA) Values / Beliefs Cultural Requests During Hospitalization:  (UTA) Spiritual Requests During Hospitalization:  (UTA)   Advance Directives (For Healthcare) Does patient have an advance directive?:  (UTA)    Additional Information 1:1 In Past 12 Months?: No CIRT Risk: No Elopement Risk: No     Disposition:  Disposition Initial  Assessment Completed for this Encounter: Yes Disposition of Patient: Other dispositions (AM psychiatric evaluation to uphold or rescind IVC) Other disposition(s): Other (Comment)  On Site Evaluation by:   Reviewed with Physician:    Eirik Schueler 06/19/2016 11:16 PM

## 2016-06-19 NOTE — Progress Notes (Signed)
EDCM spoke to patient's daughter Buck MamFunda who was leaving the ED to go home.  Funda's phone number [phone]. Buck MamFunda reports patient lives at home with her son.  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 She reports his schedule is MWF from approximately 8a-2pm.  She reports he is home with the patient Ulanda Edisonues, Thurs, Sat, Sun. Funda confirms patient's POA is her niece Jon Gillslexis. Last pcp listed in chart is Dr. Shanda BumpsJessica Copland. Buck MamFunda reports the patient does not want to go to the doctor's office.  She reports the patient will not even go to see her cardiologist. She reports they eventually would like to place the patient into a facility. EDCM provided patient's daughter with list of private duty nursing agencies, explained services. EDCM provided patient's daughter with contact information to [facility].  Informed they can assist with placement and respite care. EDCM provided patient's daughter with contact information for the [facility] [place]. EDCM also provided patient's daughter with list of doctors who make home visits such as [facility] and [facility]. EDCM provided patient's daughter with contact information to the department of social services to assist with Medicaid process for ALF/Memory care if needed. Patient's daughter thankful for services.  No further EDCM needs at this time..Marland Kitchen

## 2016-06-19 NOTE — ED Notes (Signed)
Patient changed into purple scrubs/yellow socks. Patient and belongings wanded by security.

## 2016-06-20 ENCOUNTER — Emergency Department (HOSPITAL_COMMUNITY): Payer: Medicare Other

## 2016-06-20 DIAGNOSIS — F0391 Unspecified dementia with behavioral disturbance: Secondary | ICD-10-CM | POA: Diagnosis not present

## 2016-06-20 DIAGNOSIS — F03918 Unspecified dementia, unspecified severity, with other behavioral disturbance: Secondary | ICD-10-CM | POA: Diagnosis present

## 2016-06-20 MED ORDER — HALOPERIDOL LACTATE 5 MG/ML IJ SOLN
2.5000 mg | Freq: Once | INTRAMUSCULAR | Status: AC
  Administered 2016-06-20: 2.5 mg via INTRAMUSCULAR
  Filled 2016-06-20: qty 1

## 2016-06-20 MED ORDER — LORAZEPAM 0.5 MG PO TABS
0.5000 mg | ORAL_TABLET | Freq: Four times a day (QID) | ORAL | Status: DC | PRN
  Administered 2016-06-20: 0.5 mg via ORAL
  Filled 2016-06-20: qty 1

## 2016-06-20 MED ORDER — ACETAMINOPHEN 325 MG PO TABS: 650.0000 mg | ORAL_TABLET | Freq: Once | ORAL | Status: DC

## 2016-06-20 MED ORDER — CITALOPRAM HYDROBROMIDE 10 MG PO TABS
10.0000 mg | ORAL_TABLET | Freq: Every day | ORAL | Status: DC
  Administered 2016-06-20 – 2016-06-21 (×2): 10 mg via ORAL
  Filled 2016-06-20 (×2): qty 1

## 2016-06-20 MED ORDER — ACETAMINOPHEN 650 MG RE SUPP
650.0000 mg | Freq: Once | RECTAL | Status: AC
  Administered 2016-06-20: 650 mg via RECTAL
  Filled 2016-06-20: qty 1

## 2016-06-20 NOTE — ED Notes (Addendum)
MD Effie ShyWentz made aware of bradycardia. Will continue to monitor

## 2016-06-20 NOTE — Consult Note (Signed)
[facility] Consult   Reason for Consult:  Aggressive behavior, hostile, hallucinations Referring Physician:  EDP Patient Identification: Leah Glass MRN:  [id number] Principal Diagnosis: Dementia with behavioral disturbance Diagnosis:   Patient Active Problem List   Diagnosis Date Noted  . Dementia with behavioral disturbance [F03.91] 06/20/2016    Priority: High  . HTN (hypertension) [I10] 12/16/2012    Total Time spent with patient: 45 minutes  Subjective:   Leah Glass is a 82 y.o. female patient admitted with aggressive behavior and hostility.  HPI:  Patient is an elderly woman with history of Dementia who was brought to [facility] by family for psychiatric evaluation. Patient is a poor historian, history was obtained from collateral, charts and the patient. Collateral sources revealed that patient's behavior has changed for the worse lately. She has become more aggressive, hostile and confrontational with her family members. Also, patient has not been taking care of her personal hygiene, not eating or sleeping. Few days ago, she reportedly packed her bags and left the house  at [place] and has been refusing to take her anti-hypertensive medications or see her Cardiologist. Patient currently denies psychosis, delusions, Suicidal or homicidal ideation, intent or plan.  Past Psychiatric History: denies  Risk to Self: Suicidal Ideation: No Suicidal Intent: No Is patient at risk for suicide?: No Suicidal Plan?: No Access to Means: No What has been your use of drugs/alcohol within the last 12 months?: Denies How many times?: 0 Other Self Harm Risks: Denies Triggers for Past Attempts: None known Intentional Self Injurious Behavior: None Risk to Others: Homicidal Ideation: No Thoughts of Harm to Others: No Current Homicidal Intent: No Current Homicidal Plan: No Access to Homicidal Means: No Identified Victim: Denies History of harm to others?: No Assessment  of Violence: None Noted Violent Behavior Description: Denies Does patient have access to weapons?: No Criminal Charges Pending?: No Does patient have a court date: No Prior Inpatient Therapy: Prior Inpatient Therapy:  (UTA) Prior Therapy Dates: UTA Prior Therapy Facilty/Provider(s): UTA Reason for Treatment: UTA Prior Outpatient Therapy: Prior Outpatient Therapy:  (UTA) Prior Therapy Dates: UTA Prior Therapy Facilty/Provider(s): UTA Reason for Treatment: UTA Does patient have an ACCT team?: Unknown Does patient have Intensive In-House Services?  : Unknown Does patient have [facility] services? : Unknown Does patient have P4CC services?: Unknown  Past Medical History:  Past Medical History:  Diagnosis Date  . Arthritis   . Dementia   . Hypertension    History reviewed. No pertinent surgical history. Family History: No family history on file. Family Psychiatric  History: Social History:  History  Alcohol Use No     History  Drug Use No    Social History   Social History  . Marital status: Widowed    Spouse name: N/A  . Number of children: N/A  . Years of education: N/A   Social History Main Topics  . Smoking status: Never Smoker  . Smokeless tobacco: Never Used  . Alcohol use No  . Drug use: No  . Sexual activity: Not Asked   Other Topics Concern  . None   Social History Narrative  . None   Additional Social History:    Allergies:   Allergies  Allergen Reactions  . Anesthetics, Amide Other (See Comments)    Was put in intensive care  . Penicillins Other (See Comments)    Put pt in intensive care Has patient had a PCN reaction causing immediate rash, facial/tongue/throat swelling, SOB or lightheadedness with  hypotension: no Has patient had a PCN reaction causing severe rash involving mucus membranes or skin necrosis: no Has patient had a PCN reaction that required hospitalization yes Has patient had a PCN reaction occurring within the last 10 years:  no If all of the above answers are "NO", then may proceed with Cephalosporin use.    Labs:  Results for orders placed or performed during the hospital encounter of 06/19/16 (from the past 48 hour(s))  Comprehensive metabolic panel     Status: Abnormal   Collection Time: 06/19/16  4:20 PM  Result Value Ref Range   Sodium 143 135 - 145 mmol/L   Potassium 3.9 3.5 - 5.1 mmol/L   Chloride 112 (H) 101 - 111 mmol/L   CO2 28 22 - 32 mmol/L   Glucose, Bld 92 65 - 99 mg/dL   BUN 14 6 - 20 mg/dL   Creatinine, Ser 0.66 0.44 - 1.00 mg/dL   Calcium 9.2 8.9 - 10.3 mg/dL   Total Protein 6.5 6.5 - 8.1 g/dL   Albumin 3.8 3.5 - 5.0 g/dL   AST 18 15 - 41 U/L   ALT 8 (L) 14 - 54 U/L   Alkaline Phosphatase 48 38 - 126 U/L   Total Bilirubin 1.5 (H) 0.3 - 1.2 mg/dL   GFR calc non Af Amer >60 >60 mL/min   GFR calc Af Amer >60 >60 mL/min    Comment: (NOTE) The eGFR has been calculated using the CKD EPI equation. This calculation has not been validated in all clinical situations. eGFR's persistently <60 mL/min signify possible Chronic Kidney Disease.    Anion gap 3 (L) 5 - 15  CBC with Differential     Status: Abnormal   Collection Time: 06/19/16  4:20 PM  Result Value Ref Range   WBC 7.4 4.0 - 10.5 K/uL   RBC 4.21 3.87 - 5.11 MIL/uL   Hemoglobin 11.8 (L) 12.0 - 15.0 g/dL   HCT 36.5 36.0 - 46.0 %   MCV 86.7 78.0 - 100.0 fL   MCH 28.0 26.0 - 34.0 pg   MCHC 32.3 30.0 - 36.0 g/dL   RDW 15.6 (H) 11.5 - 15.5 %   Platelets 309 150 - 400 K/uL   Neutrophils Relative % 62 %   Neutro Abs 4.6 1.7 - 7.7 K/uL   Lymphocytes Relative 24 %   Lymphs Abs 1.8 0.7 - 4.0 K/uL   Monocytes Relative 10 %   Monocytes Absolute 0.7 0.1 - 1.0 K/uL   Eosinophils Relative 4 %   Eosinophils Absolute 0.3 0.0 - 0.7 K/uL   Basophils Relative 0 %   Basophils Absolute 0.0 0.0 - 0.1 K/uL  Ethanol     Status: None   Collection Time: 06/19/16  4:23 PM  Result Value Ref Range   Alcohol, Ethyl (B) <5 <5 mg/dL    Comment:         LOWEST DETECTABLE LIMIT FOR SERUM ALCOHOL IS 5 mg/dL FOR MEDICAL PURPOSES ONLY   Urinalysis, Routine w reflex microscopic     Status: None   Collection Time: 06/19/16  5:20 PM  Result Value Ref Range   Color, Urine YELLOW YELLOW   APPearance CLEAR CLEAR   Specific Gravity, Urine 1.018 1.005 - 1.030   pH 8.0 5.0 - 8.0   Glucose, UA NEGATIVE NEGATIVE mg/dL   Hgb urine dipstick NEGATIVE NEGATIVE   Bilirubin Urine NEGATIVE NEGATIVE   Ketones, ur NEGATIVE NEGATIVE mg/dL   Protein, ur NEGATIVE NEGATIVE mg/dL  Nitrite NEGATIVE NEGATIVE   Leukocytes, UA NEGATIVE NEGATIVE    Comment: MICROSCOPIC NOT DONE ON URINES WITH NEGATIVE PROTEIN, BLOOD, LEUKOCYTES, NITRITE, OR GLUCOSE <1000 mg/dL.    Current Facility-Administered Medications  Medication Dose Route Frequency Provider Last Rate Last Dose  . amLODipine (NORVASC) tablet 5 mg  5 mg Oral Daily Daleen Bo, MD   5 mg at 06/20/16 1601  . aspirin EC tablet 81 mg  81 mg Oral Daily Daleen Bo, MD   81 mg at 06/20/16 0932  . benazepril (LOTENSIN) tablet 20 mg  20 mg Oral Daily Daleen Bo, MD   20 mg at 06/20/16 0924  . citalopram (CELEXA) tablet 10 mg  10 mg Oral Daily [place] Duquette, MD      . LORazepam (ATIVAN) tablet 0.5 mg  0.5 mg Oral Q6H PRN Corena Pilgrim, MD       Current Outpatient Prescriptions  Medication Sig Dispense Refill  . acetaminophen (TYLENOL) 650 MG CR tablet Take 650 mg by mouth every 8 (eight) hours as needed for pain. Reported on 11/09/2015    . amLODipine (NORVASC) 5 MG tablet Take 5 mg by mouth daily.     Marland Kitchen aspirin EC 81 MG tablet Take 81 mg by mouth daily.     . benazepril (LOTENSIN) 20 MG tablet Take 20 mg by mouth daily.       Musculoskeletal: Strength & Muscle Tone: within normal limits Gait & Station: unsteady Patient leans: Front  Psychiatric Specialty Exam: Physical Exam  Psychiatric: Thought content normal. Her affect is labile. Her speech is delayed. She is agitated, aggressive and actively  hallucinating. Cognition and memory are impaired. She expresses impulsivity.    Review of Systems  Constitutional: Negative.   HENT: Negative.   Eyes: Negative.   Respiratory: Negative.   Cardiovascular: Negative.   Gastrointestinal: Negative.   Genitourinary: Negative.   Musculoskeletal: Positive for myalgias.  Skin: Negative.   Endo/Heme/Allergies: Negative.   Psychiatric/Behavioral: Positive for hallucinations. The patient is nervous/anxious and has insomnia.     Blood pressure 148/62, pulse 65, temperature 98 F (36.7 C), resp. rate 10, height _0  (1.676 m), weight 54.4 kg (120 lb), SpO2 96 %.Body mass index is 19.37 kg/m.  General Appearance: Casual  Eye Contact:  Minimal  Speech:  Slow  Volume:  Decreased  Mood:  Irritable  Affect:  Labile  Thought Process:  Disorganized  Orientation:  Other:  only to person  Thought Content:  Illogical  Suicidal Thoughts:  No  Homicidal Thoughts:  No  Memory:  Immediate;   Fair Recent;   Poor Remote;   Poor  Judgement:  Poor  Insight:  Shallow  Psychomotor Activity:  Psychomotor Retardation  Concentration:  Concentration: Fair and Attention Span: Fair  Recall:  [employer] of Knowledge:  Fair  Language:  Good  Akathisia:  No  Handed:  Right  AIMS (if indicated):     Assets:  Social Support  ADL's:  Impaired  Cognition:  Impaired,  Mild  Sleep:   poor     Treatment Plan Summary: -Daily contact with patient to assess and evaluate symptoms and progress in treatment. -Crisis stabilization - Medication management. -Celexa 62m daily for aggression. -Ativan 0.572mq6 prn for agitation  Disposition: Recommend psychiatric Inpatient admission when medically cleared. Supportive therapy provided about ongoing stressors. Will refer to [facility]iatric inpatient hospital for stabilization  AkCorena PilgrimMD 06/20/2016 9:50 AM

## 2016-06-20 NOTE — ED Notes (Signed)
Restraints removed. Pt calm and sleeping at this time.

## 2016-06-20 NOTE — ED Notes (Signed)
Patient transported to X-ray 

## 2016-06-20 NOTE — ED Notes (Signed)
EKG showed to MD Horton

## 2016-06-20 NOTE — BH Assessment (Signed)
[facility] Assessment Progress Note  Per Thedore MinsMojeed Akintayo, MD, this pt requires psychiatric hospitalization at this time.  Pt presents under IVC initiated by pt's niece, Alexis B. Clinton Quanthalmers, and upheld by Dr Jannifer FranklinAkintayo.  The following facilities have been contacted to seek placement for this pt, with results as noted:  Beds available, information sent, decision pending:  [facility]   [facility]   Pt is not appropriate for referral:  [facility] is [facility] is [facility] (dementia is exclusionary) [facility] (dementia is exclusionary) [facility] (wrong insurance panel)   Doylene Canninghomas Josiyah Tozzi, MA Triage Specialist [phone]

## 2016-06-20 NOTE — BH Assessment (Addendum)
[facility] Assessment Progress Note  At 12:26 Leah Glass calls from [facility].  Pt has been pre-accepted to their facility by Dr Joseph ArtSubedi, pending anticipated discharges.  She will call back when a bed becomes available.  Leah MeansJamison Lord, Leah Glass, concurs with this decision.  Pt's nurse has been updated.  Leah Canninghomas Kameryn Tisdel, Leah Glass Triage Specialist [phone]    Addendum:  At 16:19 this writer spoke to [facility].  She reports that she is concerned about pt's temperature.  If it remains stable overnight they will accept her in the morning.  Leah Glass has been notified.  Leah Canninghomas Lorrene Graef, Leah Glass Triage Specialist [phone]

## 2016-06-20 NOTE — ED Notes (Signed)
Patient's niece brought Power of Attorney paperwork to ED. Copies made and placed in paper chart. TTS aware.

## 2016-06-20 NOTE — ED Notes (Signed)
Bed: WA30 Expected date:  Expected time:  Means of arrival:  Comments: 

## 2016-06-21 NOTE — ED Notes (Signed)
[facility] Yolande JollyGrace Faircloth, RN. Sheriffs office is aware of need for transport, stated that it would be a little but he would call 30 minutes prior to their arrival. Will continue to monitor pt.

## 2016-06-21 NOTE — BH Assessment (Addendum)
[facility] Assessment Progress Note  At 08:41 this Clinical research associatewriter spoke to [facility] at [facility].  Pt has been accepted to their facility by Dr Joseph ArtSubedi, and they now have a bed available.  Please call report to [phone].  Thedore MinsMojeed Akintayo, MD concurs with this decision.  Pt's nurse has been notified and agrees to call report.  [facility] is under IVC [facility]  Doylene Canninghomas Fay Bagg, MA Triage Specialist [phone]

## 2016-09-27 DEATH — deceased
